# Patient Record
Sex: Male | Born: 1965 | Race: White | Hispanic: No | Marital: Married | Smoking: Current every day smoker
Health system: Southern US, Community
[De-identification: ages and names within clinical notes are randomized; demographics above are authoritative.]

---

## 2019-04-01 ENCOUNTER — Other Ambulatory Visit: Payer: Self-pay

## 2019-04-01 ENCOUNTER — Encounter: Payer: Self-pay | Admitting: Emergency Medicine

## 2019-04-01 ENCOUNTER — Emergency Department

## 2019-04-01 ENCOUNTER — Emergency Department
Admission: EM | Admit: 2019-04-01 | Discharge: 2019-04-01 | Disposition: A | Discharge status: Home / Self Care | Attending: Student | Admitting: Student

## 2019-04-01 DIAGNOSIS — R197 Diarrhea, unspecified: Secondary | ICD-10-CM | POA: Diagnosis not present

## 2019-04-01 DIAGNOSIS — R109 Unspecified abdominal pain: Secondary | ICD-10-CM | POA: Diagnosis present

## 2019-04-01 DIAGNOSIS — Z20828 Contact with and (suspected) exposure to other viral communicable diseases: Secondary | ICD-10-CM | POA: Diagnosis not present

## 2019-04-01 DIAGNOSIS — R1030 Lower abdominal pain, unspecified: Secondary | ICD-10-CM

## 2019-04-01 DIAGNOSIS — Z20822 Contact with and (suspected) exposure to covid-19: Secondary | ICD-10-CM

## 2019-04-01 LAB — COMPREHENSIVE METABOLIC PANEL
ALT: 69 U/L — ABNORMAL HIGH (ref 0–44)
AST: 55 U/L — ABNORMAL HIGH (ref 15–41)
Albumin: 4.2 g/dL (ref 3.5–5.0)
Alkaline Phosphatase: 80 U/L (ref 38–126)
Anion gap: 10 (ref 5–15)
BUN: 18 mg/dL (ref 6–20)
CO2: 21 mmol/L — ABNORMAL LOW (ref 22–32)
Calcium: 8.7 mg/dL — ABNORMAL LOW (ref 8.9–10.3)
Chloride: 105 mmol/L (ref 98–111)
Creatinine, Ser: 0.78 mg/dL (ref 0.61–1.24)
GFR calc Af Amer: >60 mL/min (ref >60)
GFR calc non Af Amer: >60 mL/min (ref >60)
Glucose, Bld: 213 mg/dL — ABNORMAL HIGH (ref 70–99)
Potassium: 3.6 mmol/L (ref 3.5–5.1)
Sodium: 136 mmol/L (ref 135–145)
Total Bilirubin: 0.8 mg/dL (ref 0.3–1.2)
Total Protein: 7.1 g/dL (ref 6.5–8.1)

## 2019-04-01 LAB — URINALYSIS, COMPLETE (UACMP) WITH MICROSCOPIC
Bacteria, UA: NONE SEEN
Bilirubin Urine: NEGATIVE
Glucose, UA: 150 mg/dL — AB
Hgb urine dipstick: NEGATIVE
Ketones, ur: 20 mg/dL — AB
Nitrite: NEGATIVE
Protein, ur: 30 mg/dL — AB
Specific Gravity, Urine: 1.034 — ABNORMAL HIGH (ref 1.005–1.030)
pH: 5 (ref 5.0–8.0)

## 2019-04-01 LAB — CBC WITH DIFFERENTIAL/PLATELET
Abs Immature Granulocytes: 0.04 10*3/uL (ref 0.00–0.07)
Basophils Absolute: 0 10*3/uL (ref 0.0–0.1)
Basophils Relative: 0 %
Eosinophils Absolute: 0 10*3/uL (ref 0.0–0.5)
Eosinophils Relative: 0 %
HCT: 40.5 % (ref 39.0–52.0)
Hemoglobin: 14 g/dL (ref 13.0–17.0)
Immature Granulocytes: 1 %
Lymphocytes Relative: 26 %
Lymphs Abs: 1.3 10*3/uL (ref 0.7–4.0)
MCH: 28.9 pg (ref 26.0–34.0)
MCHC: 34.6 g/dL (ref 30.0–36.0)
MCV: 83.5 fL (ref 80.0–100.0)
Monocytes Absolute: 0.6 10*3/uL (ref 0.1–1.0)
Monocytes Relative: 11 %
Neutro Abs: 3.1 10*3/uL (ref 1.7–7.7)
Neutrophils Relative %: 62 %
Platelets: 234 10*3/uL (ref 150–400)
RBC: 4.85 MIL/uL (ref 4.22–5.81)
RDW: 12.9 % (ref 11.5–15.5)
WBC: 5 10*3/uL (ref 4.0–10.5)
nRBC: 0 % (ref 0.0–0.2)

## 2019-04-01 LAB — TYPE AND SCREEN
ABO/RH(D): A POS
Antibody Screen: NEGATIVE

## 2019-04-01 LAB — LIPASE, BLOOD: Lipase: 32 U/L (ref 11–51)

## 2019-04-01 MED ORDER — MORPHINE SULFATE (PF) 4 MG/ML IV SOLN
4.0000 mg | Freq: Once | INTRAVENOUS | Status: AC
  Administered 2019-04-01: 4 mg via INTRAVENOUS
  Filled 2019-04-01: qty 1

## 2019-04-01 MED ORDER — SODIUM CHLORIDE 0.9 % IV BOLUS
1000.0000 mL | Freq: Once | INTRAVENOUS | Status: AC
  Administered 2019-04-01: 1000 mL via INTRAVENOUS

## 2019-04-01 MED ORDER — ONDANSETRON HCL 4 MG/2ML IJ SOLN
4.0000 mg | Freq: Once | INTRAMUSCULAR | Status: AC
  Administered 2019-04-01: 4 mg via INTRAVENOUS
  Filled 2019-04-01: qty 2

## 2019-04-01 MED ORDER — IOHEXOL 300 MG/ML  SOLN
100.0000 mL | Freq: Once | INTRAMUSCULAR | Status: AC | PRN
  Administered 2019-04-01: 100 mL via INTRAVENOUS

## 2019-04-01 NOTE — ED Triage Notes (Signed)
Pt reports had another flare up of diverticulitis that started last Saturday. Pt reports was given cipro and flagyl on 10/7 but is not getting any relief.

## 2019-04-01 NOTE — ED Provider Notes (Addendum)
[facility] Emergency Department Provider Note  ____________________________________________   First MD Initiated Contact with Patient 04/01/19 1435     (approximate)  I have reviewed the triage vital signs and the nursing notes.  History  Chief Complaint Abdominal Pain and Diarrhea    HPI CAMAR EVERINGHAM is a 53 y.o. male with a history of diverticulitis who presents for abdominal pain and diarrhea.  Patient states his symptoms first started on 10/3, and are consistent with his prior episode of diverticulitis.  He reports multiple episodes of loose stool daily.  Abdominal pain is sharp, left-sided, and moderate in intensity.  Associated with nausea, no vomiting.  He reports some dark appearing stools, as well as dark urine.  He is not on any blood thinning medications, he has not used any Imodium or Pepto-Bismol.  He has had decreased PO because of his pain and generalized malaise.  He denies any dysuria or hematuria.  No history of nephrolithiasis.  He was started on Cipro and Flagyl on 10/7 but states he has not had any improvement with this.  He reports fever at the onset of his symptoms, which have now improved.    Past Medical Hx History reviewed. No pertinent past medical history.  Problem List There are no active problems to display for this patient.   Past Surgical Hx History reviewed. No pertinent surgical history.  Medications Prior to Admission medications   Not on File    Allergies Patient has no allergy information on record.  Family Hx No family history on file.  Social Hx Social History   Tobacco Use  . Smoking status: Not on file  Substance Use Topics  . Alcohol use: Not on file  . Drug use: Not on file     Review of Systems  Constitutional: + fever Eyes: Negative for visual changes. ENT: Negative for sore throat. Cardiovascular: Negative for chest pain. Respiratory: Negative for shortness of breath.  Gastrointestinal: + abdominal pain, diarrhea Genitourinary: Negative for dysuria. Musculoskeletal: Negative for leg swelling. Skin: Negative for rash. Neurological: Negative for for headaches.   Physical Exam  Vital Signs: ED Triage Vitals  Enc Vitals Group     BP 04/01/19 1431 118/76     Pulse Rate 04/01/19 1431 (!) 104     Resp 04/01/19 1431 (!) 21     Temp 04/01/19 1431 98 F (36.7 C)     Temp Source 04/01/19 1431 Oral     SpO2 04/01/19 1431 97 %     Weight 04/01/19 1430 240 lb (108.9 kg)     Height 04/01/19 1430 6\' 2"  (1.88 m)     Head Circumference --      Peak Flow --      Pain Score 04/01/19 1430 6     Pain Loc --      Pain Edu? --      Excl. in [place]? --     Constitutional: Alert and oriented. Appears somewhat uncomfortable.  Head: Normocephalic. Atraumatic. Eyes: Conjunctivae clear. Sclera anicteric. Nose: No congestion. No rhinorrhea. Mouth/Throat: Mucous membranes are moist.  Neck: No stridor.   Cardiovascular: Normal rate, regular rhythm. Extremities well perfused. Respiratory: Normal respiratory effort.  Lungs CTAB. Gastrointestinal: Soft. Left sided abdominal tenderness, no rebound or guarding.  Remainder of abdomen is soft and nontender. Rectal: RN chaperone present. Brown stool, guaiac negative. Musculoskeletal: No lower extremity edema. No deformities. Neurologic:  Normal speech and language. No gross focal neurologic deficits are appreciated.  Skin: Skin  is warm, dry and intact. No rash noted. Psychiatric: Mood and affect are appropriate for situation.  EKG  Rate: 107 Rhythm: sinus Axis: borderline left Intervals: within normal limits No acute ischemic changes No STEMI   Radiology  CT: IMPRESSION:  1. No acute findings are noted in the abdomen or pelvis to account  for the patient's symptoms.  2. However, there is patchy ground-glass attenuation airspace  disease in the lung bases bilaterally, the appearance of which is  concerning for  atypical infection such as COVID-19 infection.  Further clinical evaluation is strongly recommended.  3. Mild colonic diverticulosis without evidence of acute  diverticulitis at this time.  4. Aortic atherosclerosis.   XR: IMPRESSION:  Patchy bilateral lower lung airspace opacities likely represent an  infectious process.    Procedures  Procedure(s) performed (including critical care):  Procedures   Initial Impression / Assessment and Plan / ED Course  53 y.o. male who presents to the ED for lower abdominal pain, diarrhea.  Ddx: diverticulitis, possible complicated diverticulitis given time course, colitis, other viral process  Plan: labs, imaging, symptom control, reassess  Labs without actionable derangements.  UA consistent with mild dehydration.  CT scan without acute intra-abdominal abnormalities, however the visualized lung bases do appear concerning for possible COVID.  This could be the etiology of his fever and diarrhea.  Updated patient on these results, he does state that he feels more fatigued and winded compared to normal, and that his taste and smell have been decreased over the same time period. He attributed this to assumed diverticulitis. He denies any known COVID exposures.   As such, will obtain a chest x-ray and COVID swab.  At this point he is hemodynamically stable, no evidence of respiratory distress, no hypoxia.  He is stable for discharge.  He understands that his COVID swab is pending, and understands the need for social distancing and quarantining until they results. Since he is already several days into his antibiotic regimen, advised completion of this. Discussed supportive care, return precautions.  Patient agreeable with plan and discharge.  Final Clinical Impression(s) / ED Diagnosis  Final diagnoses:  Lower abdominal pain  Diarrhea in adult patient  Suspected COVID-19 virus infection       Note:  This document was prepared using Dragon voice  recognition software and may include unintentional dictation errors.   Lilia Pro., MD 04/01/19 1744    Lilia Pro., MD 04/02/19 1002

## 2019-04-01 NOTE — Discharge Instructions (Signed)
Thank you for letting us take care of you in the emergency department.   At this time, your coronavirus swab results are pending. You should hear your results in 1-2 days if they are positive.   In the meantime, it is important to take precautions in case you are positive. This includes quarantining, wearing a mask, and social distancing.   Continue to take over the counter acetaminophen and ibuprofen as directed on the box to help with fevers as well as aches and pains.   Please return to the ER for any new or worsening symptoms, such as difficulty breathing, vomiting and diarrhea, or chest pain.

## 2019-04-02 LAB — SARS CORONAVIRUS 2 (TAT 6-24 HRS): SARS Coronavirus 2: NEGATIVE

## 2020-02-19 ENCOUNTER — Other Ambulatory Visit: Payer: Self-pay

## 2020-02-19 ENCOUNTER — Ambulatory Visit (INDEPENDENT_AMBULATORY_CARE_PROVIDER_SITE_OTHER): Admitting: Urology

## 2020-02-19 VITALS — BP 131/83 | HR 90 | Ht 74.0 in | Wt 236.7 lb

## 2020-02-19 DIAGNOSIS — N5082 Scrotal pain: Secondary | ICD-10-CM

## 2020-02-19 DIAGNOSIS — N4341 Spermatocele of epididymis, single: Secondary | ICD-10-CM

## 2020-02-19 LAB — URINALYSIS, COMPLETE
Bilirubin, UA: NEGATIVE
Leukocytes,UA: NEGATIVE
Nitrite, UA: NEGATIVE
Protein,UA: NEGATIVE
RBC, UA: NEGATIVE
Specific Gravity, UA: >1.03 — ABNORMAL HIGH (ref 1.005–1.030)
Urobilinogen, Ur: 0.2 mg/dL (ref 0.2–1.0)
pH, UA: 6 (ref 5.0–7.5)

## 2020-02-19 LAB — MICROSCOPIC EXAMINATION: Bacteria, UA: NONE SEEN

## 2020-02-19 NOTE — Progress Notes (Signed)
02/19/2020 9:51 AM   Alan Hamilton 1965/11/14 [id number]  Referring provider: Dionicia Abler, PA-C [address] [place],  [place] [address]  CC:   HPI:  [name] was referred for pelvic pain.  He had left groin discomfort for a few weeks. Nothing seems to make it better or worse. Resting makes it better. He sits a lot and makes it worse. Also he started working out again a few weeks ago. He has no voiding complaints. He has good stream. He needs to drink more water.   He had vasectomy about 10 years ago and bilateral hernia repairs in about 2003. 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 He has five kids still at home and three grown and gone.   His 09/21 PSA was 0.47.  He has a history of erectile dysfunction and takes Cialis 20 mg as needed.  PMH: No past medical history on file.  Surgical History: No past surgical history on file.  Home Medications:  Allergies as of 02/19/2020      Reactions   Statins Other (See Comments)   Muscle aches      Medication List       Accurate as of February 19, 2020  9:51 AM. If you have any questions, ask your nurse or doctor.        glipiZIDE 5 MG 24 hr tablet Commonly known as: GLUCOTROL XL Take 5 mg by mouth daily.   lisinopril 5 MG tablet Commonly known as: ZESTRIL Take 5 mg by mouth daily.   metFORMIN 500 MG 24 hr tablet Commonly known as: GLUCOPHAGE-XR Take 1,000 mg by mouth 2 (two) times daily.   Oxycodone HCl 10 MG Tabs Take 10 mg by mouth 4 (four) times daily as needed.   Precision QID Test test strip Generic drug: glucose blood Use 3 (three) times daily Use as instructed.   pregabalin 25 MG capsule Commonly known as: LYRICA Take by mouth.   Procysbi 300 MG Pack Generic drug: Cysteamine Bitartrate by XX route as directed   rosuvastatin 10 MG tablet Commonly known as: CRESTOR Take 10 mg by mouth at bedtime.   tadalafil 20 MG tablet Commonly known as: CIALIS   zolpidem  5 MG tablet Commonly known as: AMBIEN Take 5 mg by mouth at bedtime as needed.       Allergies:  Allergies  Allergen Reactions  . Statins Other (See Comments)    Muscle aches    Family History: No family history on file.  Social History:  has no history on file for tobacco use, alcohol use, and drug use.   Physical Exam: BP 131/83 (BP Location: Left Arm, Patient Position: Sitting, Cuff Size: Normal)   Pulse 90   Ht 6\' 2"  (1.88 m)   Wt 236 lb 11.2 oz (107.4 kg)   BMI 30.39 kg/m   Constitutional:  Alert and oriented, No acute distress. HEENT: [place] AT, moist mucus membranes.  Trachea midline, no masses. Cardiovascular: No clubbing, cyanosis, or edema. Respiratory: Normal respiratory effort, no increased work of breathing. GI: Abdomen is soft, nontender, nondistended, no abdominal masses GU: No CVA tenderness GU: Penis circumcised, normal foreskin, testicles descended bilaterally and palpably normal, left epididymis palpably normal and right 10 mm head spermatocele.  DRE: Prostate 30 g, smooth without hard area or nodule Lymph: No cervical or inguinal lymphadenopathy. Skin: No rashes, bruises or suspicious lesions. Neurologic: Grossly intact, no focal deficits, moving all 4 extremities. Psychiatric: Normal mood and affect.  Laboratory Data: Lab Results  Component Value Date   WBC 5.0 04/01/2019   HGB 14.0 04/01/2019   HCT 40.5 04/01/2019   MCV 83.5 04/01/2019   PLT 234 04/01/2019    Lab Results  Component Value Date   CREATININE 0.78 04/01/2019    No results found for: PSA  No results found for: TESTOSTERONE  No results found for: HGBA1C  Urinalysis    Component Value Date/Time   COLORURINE AMBER (A) 04/01/2019 1451   APPEARANCEUR HAZY (A) 04/01/2019 1451   LABSPEC 1.034 (H) 04/01/2019 1451   PHURINE 5.0 04/01/2019 1451   GLUCOSEU 150 (A) 04/01/2019 1451   HGBUR NEGATIVE 04/01/2019 1451   BILIRUBINUR NEGATIVE 04/01/2019 1451   KETONESUR 20 (A)  04/01/2019 1451   PROTEINUR 30 (A) 04/01/2019 1451   NITRITE NEGATIVE 04/01/2019 1451   LEUKOCYTESUR TRACE (A) 04/01/2019 1451    Lab Results  Component Value Date   BACTERIA NONE SEEN 04/01/2019    Pertinent Imaging: N/a  No results found for this or any previous visit.  No results found for this or any previous visit.  No results found for this or any previous visit.  No results found for this or any previous visit.  No results found for this or any previous visit.  No results found for this or any previous visit.  No results found for this or any previous visit.  No results found for this or any previous visit.   Assessment & Plan:    1. Scrotum pain He has a benign exam, normal PSA and UA.  I think a lot of this has to do with his activity changing recently.  He needs to continue to stay well-hydrated, do some stretching, take some NSAIDs.  We discussed referral to physical and occupational therapy of the pelvic floor and he will consider. - Urinalysis, Complete  2. Spermatocele  We discussed the benign nature of spermatoceles.  We discussed obtaining a scrotal ultrasound and he declined.  His testicles are easy to examine and were palpably normal.  He will continue to do self exams. No follow-ups on file.  He will call or return to clinic prn if these symptoms worsen or fail to improve as anticipated or if he notes any changes on self exam.   Alan Aloe, MD  [facility] [address], [place], [place] [address] [phone]

## 2020-03-18 IMAGING — CT CT ABD-PELV W/ CM
2 of 5 series · 15 of 46 positions shown, 17 images · IV contrast (APPLIED)
Comparison: No priors.

CLINICAL DATA: 53-year-old male with history of left lower quadrant
abdominal pain for the past 10 days.

EXAM:
CT ABDOMEN AND PELVIS WITH CONTRAST
TECHNIQUE: Multidetector CT imaging of the abdomen and pelvis was performed
using the standard protocol following bolus administration of
intravenous contrast.
CONTRAST:  100mL OMNIPAQUE IOHEXOL 300 MG/ML  SOLN

[Series 2: axial st · axial · 0.97mm/px · z∈[+270,+750]mm · 12 of 108 slices shown, 14 images]
[im 6/108  soft-tissue]
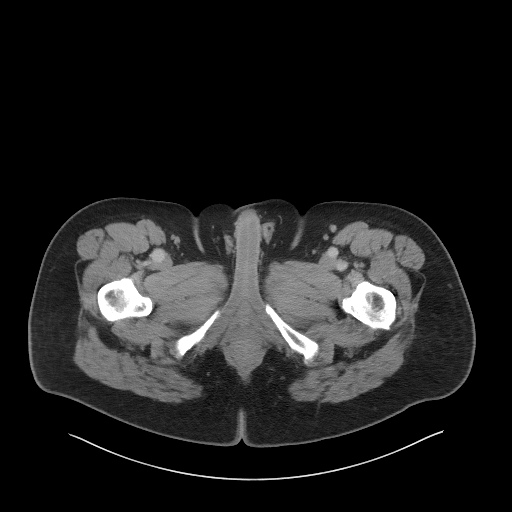
[im 6/108  bone]
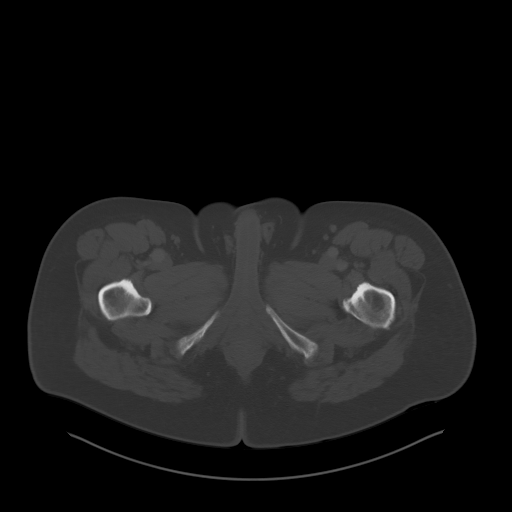
[im 17/108  soft-tissue]
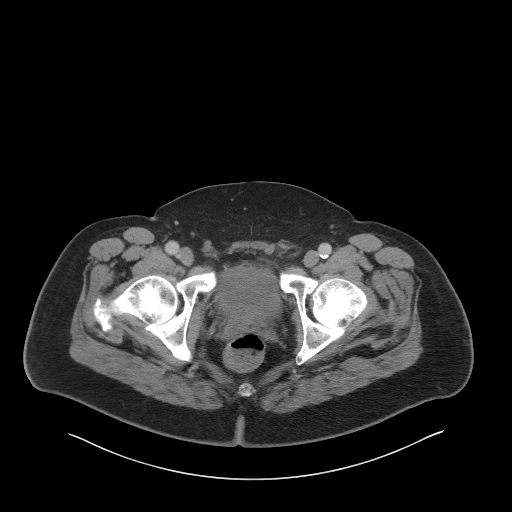
[im 22/108  soft-tissue]
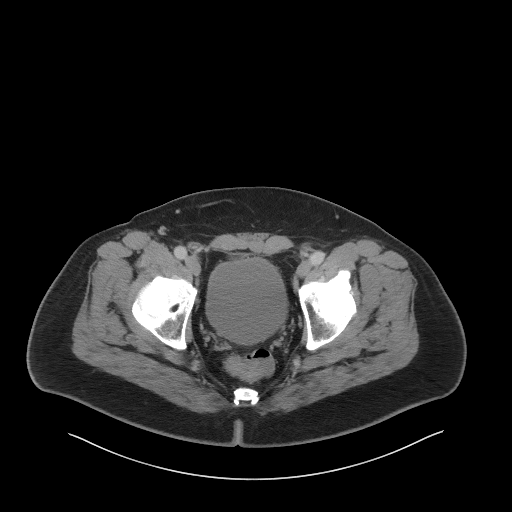
[im 33/108  soft-tissue]
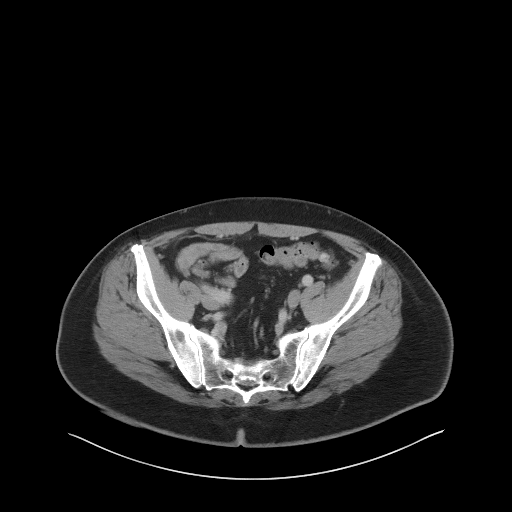
[im 43/108  soft-tissue]
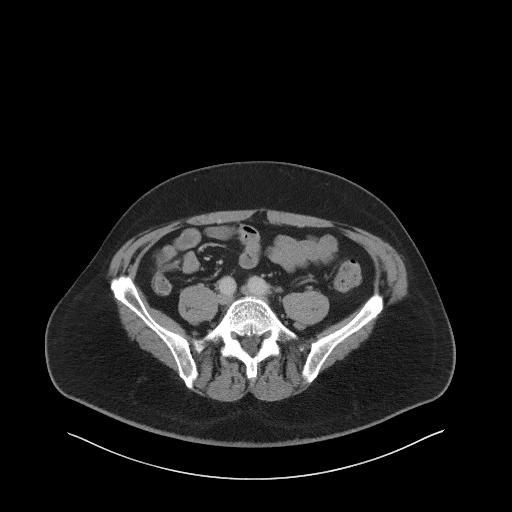
[im 49/108  soft-tissue]
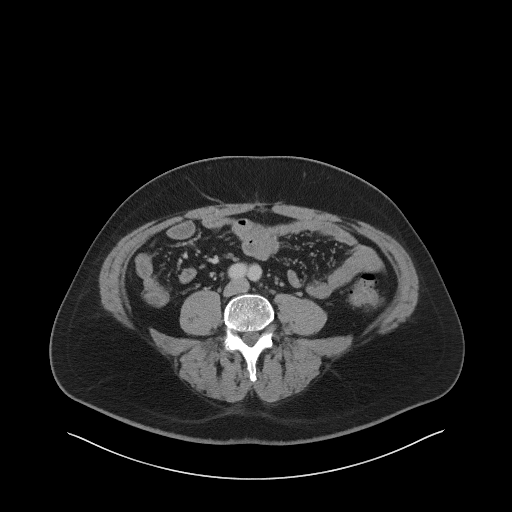
[im 59/108  soft-tissue]
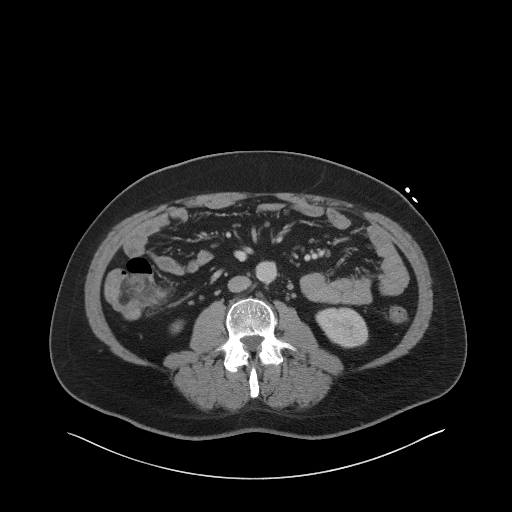
[im 65/108  soft-tissue]
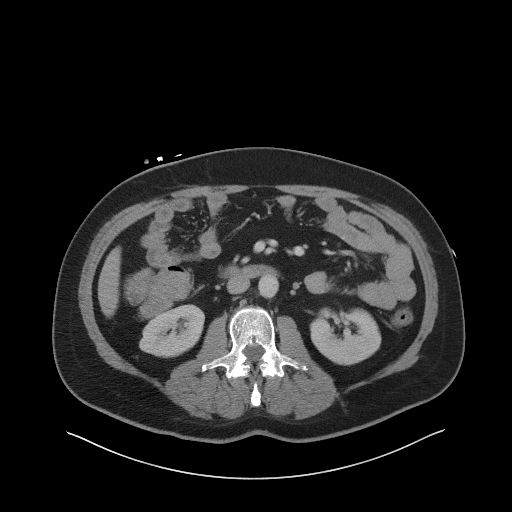
[im 75/108  soft-tissue]
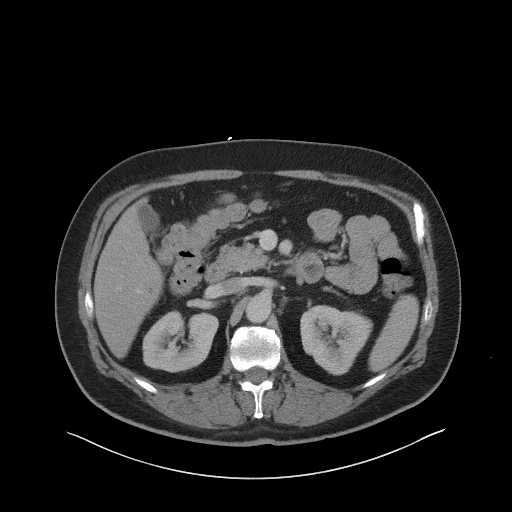
[im 75/108  bone]
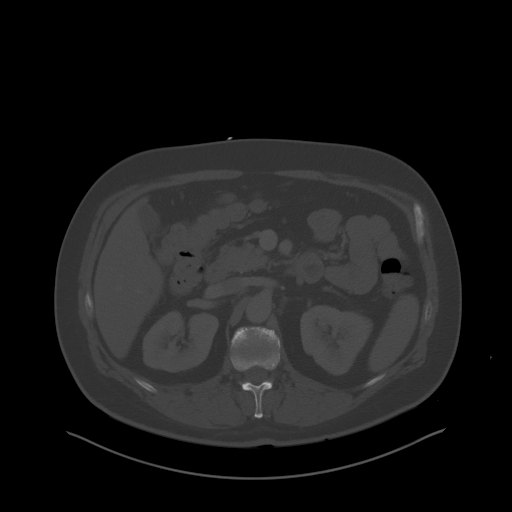
[im 86/108  soft-tissue]
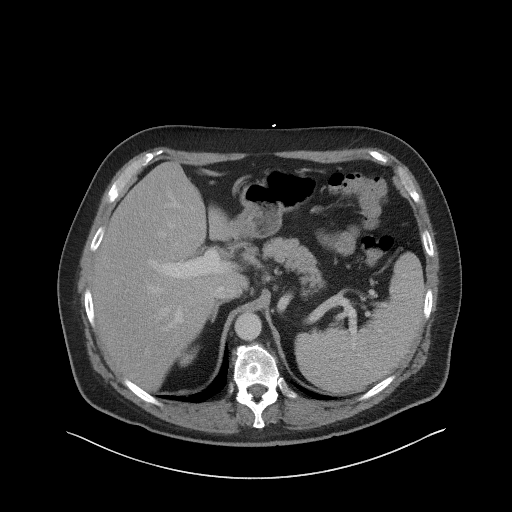
[im 91/108  soft-tissue]
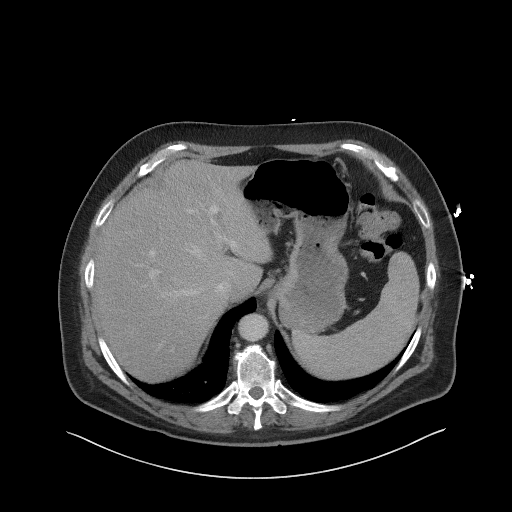
[im 102/108  soft-tissue]
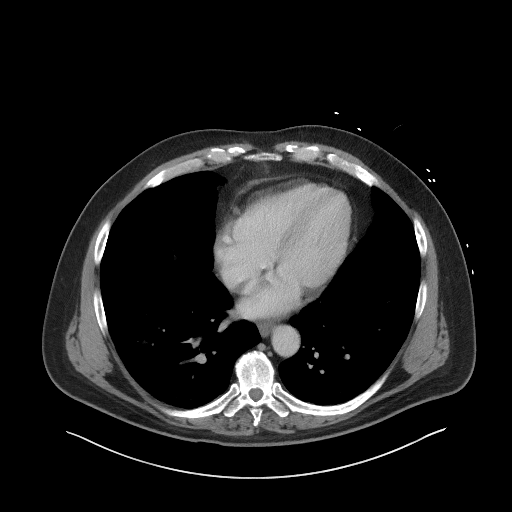

[Series 5: coronal st · coronal · 0.88mm/px · 3 of 99 slices shown]
[im 33/99  soft-tissue]
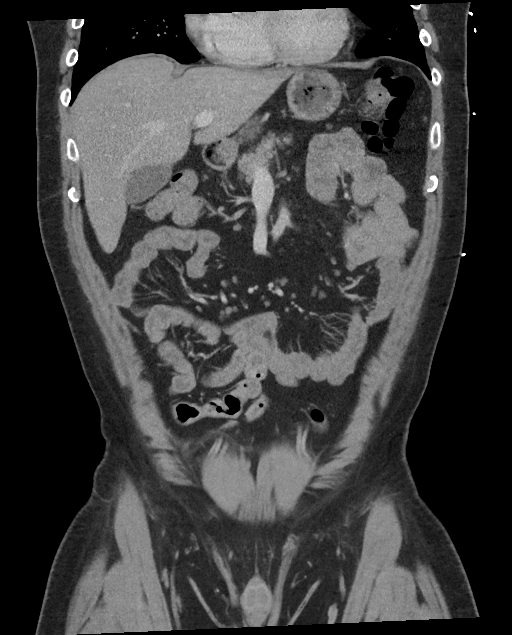
[im 44/99  soft-tissue]
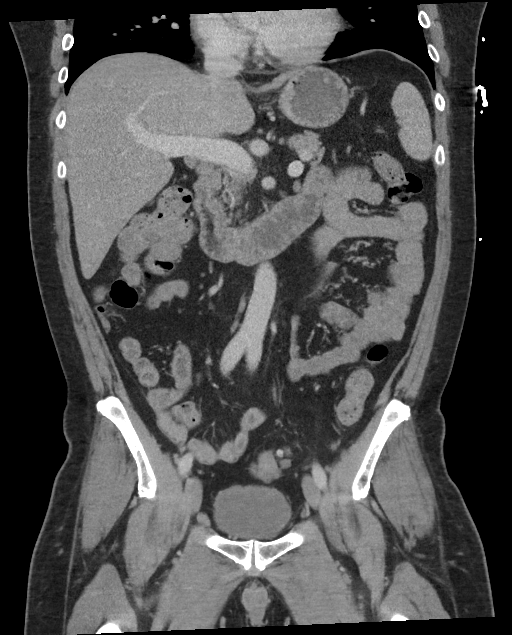
[im 55/99  soft-tissue]
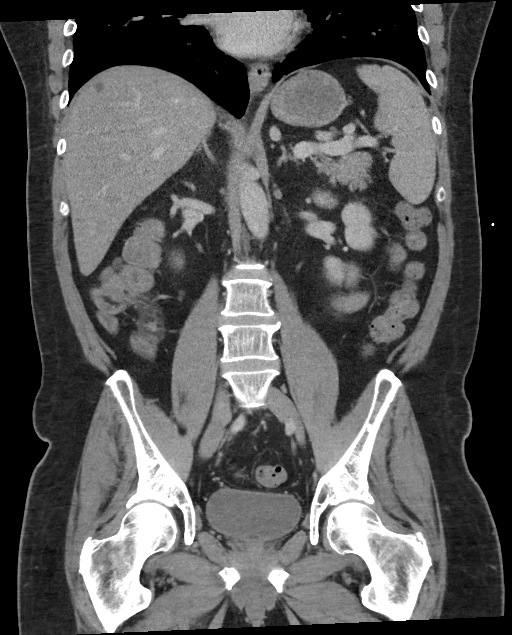

[15 of 46 positions shown; findings below may reference images not displayed]

FINDINGS: Lower chest: Patchy multifocal areas of ground-glass attenuation
noted in the visualized lung bases, concerning for multilobar
pneumonia.

Hepatobiliary: 1.2 cm low-attenuation lesion in segment 7 of the
liver, compatible with a simple cyst. Other subcentimeter
low-attenuation lesions scattered throughout the liver, incompletely
characterized on today's non-contrast CT examination, but
statistically likely to represent tiny cysts. No other larger more
suspicious appearing hepatic lesions. No intra or extrahepatic
biliary ductal dilatation. Gallbladder is unremarkable in
appearance.

Pancreas: No pancreatic mass. No pancreatic ductal dilatation. No
pancreatic or peripancreatic fluid collections or inflammatory
changes.

Spleen: Unremarkable.

Adrenals/Urinary Tract: Bilateral kidneys and bilateral adrenal
glands are normal in appearance. No hydroureteronephrosis. Urinary
bladder is normal in appearance.

Stomach/Bowel: Normal appearance of the stomach. No pathologic
dilatation of small bowel or colon. Numerous colonic diverticulae
are noted, without surrounding inflammatory changes to suggest an
acute diverticulitis at this time normal appendix.

Vascular/Lymphatic: Aortic atherosclerosis. No lymphadenopathy noted
in the abdomen or pelvis.

Reproductive: Prostate gland and seminal vesicles are unremarkable
in appearance.

Other: No significant volume of ascites.  No pneumoperitoneum.

Musculoskeletal: There are no aggressive appearing lytic or blastic
lesions noted in the visualized portions of the skeleton.
IMPRESSION: 1. No acute findings are noted in the abdomen or pelvis to account
for the patient's symptoms.
2. However, there is patchy ground-glass attenuation airspace
disease in the lung bases bilaterally, the appearance of which is
concerning for atypical infection such as [id number] infection.
Further clinical evaluation is strongly recommended.
3. Mild colonic diverticulosis without evidence of acute
diverticulitis at this time.
4. Aortic atherosclerosis.

These results were called by telephone at the time of interpretation
on 04/01/2019 at [DATE] to provider RTOYOTA JOSHJAX, who verbally
acknowledged these results.

## 2020-09-26 ENCOUNTER — Other Ambulatory Visit: Payer: Self-pay

## 2020-09-26 ENCOUNTER — Encounter: Payer: Self-pay | Admitting: Dermatology

## 2020-09-26 ENCOUNTER — Ambulatory Visit (INDEPENDENT_AMBULATORY_CARE_PROVIDER_SITE_OTHER): Admitting: Dermatology

## 2020-09-26 DIAGNOSIS — L820 Inflamed seborrheic keratosis: Secondary | ICD-10-CM

## 2020-09-26 DIAGNOSIS — D2361 Other benign neoplasm of skin of right upper limb, including shoulder: Secondary | ICD-10-CM

## 2020-09-26 DIAGNOSIS — L309 Dermatitis, unspecified: Secondary | ICD-10-CM

## 2020-09-26 DIAGNOSIS — D239 Other benign neoplasm of skin, unspecified: Secondary | ICD-10-CM

## 2020-09-26 DIAGNOSIS — D485 Neoplasm of uncertain behavior of skin: Secondary | ICD-10-CM

## 2020-09-26 DIAGNOSIS — L219 Seborrheic dermatitis, unspecified: Secondary | ICD-10-CM

## 2020-09-26 MED ORDER — HYDROCORTISONE 2.5 % EX CREA: TOPICAL_CREAM | CUTANEOUS | 1 refills | Status: DC

## 2020-09-26 MED ORDER — KETOCONAZOLE 2 % EX SHAM: MEDICATED_SHAMPOO | CUTANEOUS | 2 refills | Status: AC

## 2020-09-26 MED ORDER — KETOCONAZOLE 2 % EX CREA: TOPICAL_CREAM | CUTANEOUS | 2 refills | Status: DC

## 2020-09-26 MED ORDER — TRIAMCINOLONE ACETONIDE 0.1 % EX OINT: TOPICAL_OINTMENT | CUTANEOUS | 1 refills | Status: AC

## 2020-09-26 NOTE — Progress Notes (Signed)
New Patient Visit  Subjective  Alan Hamilton is a 55 y.o. male who presents for the following: Skin Problem (New patient here today for areas at hands that are dry and cracking. Present for years but has worsened this year with bleeding.  Also with redness and sensitivity at face, mostly around nose and beard area. Patient has a history of psoriasis but has been clear for many years. ).  Patient with hx of dysplastic nevus.   The following portions of the chart were reviewed this encounter and updated as appropriate:   Allergies  Meds  Problems  Med Hx  Surg Hx  Fam Hx      Review of Systems:  No other skin or systemic complaints except as noted in HPI or Assessment and Plan.  Objective  Well appearing patient in no apparent distress; mood and affect are within normal limits.  A focused examination was performed including face, neck, chest and back and arms, hands. Relevant physical exam findings are noted in the Assessment and Plan.  Objective  Left posterior shoulder: 0.6cm scaly pink papule    Objective  Right upper arm: Firm pink/brown papulenodule with dimple sign.   Objective  bilateral hands: Scaly pink plaques  Objective  Face: Pink patches with greasy scale.    Assessment & Plan  Neoplasm of uncertain behavior of skin Left posterior shoulder  Skin / nail biopsy Type of biopsy: tangential   Informed consent: discussed and consent obtained   Timeout: patient name, date of birth, surgical site, and procedure verified   Patient was prepped and draped in usual sterile fashion: Area prepped with isopropyl alcohol. Anesthesia: the lesion was anesthetized in a standard fashion   Local anesthetic: 0.5% bupivicaine buffered w/ 8.4% NaHCO3. Instrument used: flexible razor blade   Hemostasis achieved with: aluminum chloride   Outcome: patient tolerated procedure well   Post-procedure details: wound care instructions given   Additional details:  Mupirocin  and a bandage applied  Specimen 1 - Surgical pathology Differential Diagnosis: R/o Verruca vs SCC vs other  Check Margins: No 0.6cm scaly pink papule  Dermatofibroma Right upper arm  Benign-appearing.  Observation.  Call clinic for new or changing lesions.  Recommend daily use of broad spectrum spf 30+ sunscreen to sun-exposed areas.    Hand dermatitis bilateral hands  Recommend Gentle Skin Care. Handout attached.   Start TMC 0.1% ointment BID to hands for up to 2 weeks as needed for flares. Avoid applying to face, groin, and axilla. Use as directed. Risk of skin atrophy with long-term use reviewed.   Topical steroids (such as triamcinolone, fluocinolone, fluocinonide, mometasone, clobetasol, halobetasol, betamethasone, hydrocortisone) can cause thinning and lightening of the skin if they are used for too long in the same area. Your physician has selected the right strength medicine for your problem and area affected on the body. Please use your medication only as directed by your physician to prevent side effects.    Ordered Medications: triamcinolone ointment (KENALOG) 0.1 %  Seborrheic dermatitis Face  Chronic condition with duration over one year. Condition is bothersome to patient. Currently flared.  Start ketoconazole 2% shampoo 3x weekly leave on for 10 minutes before rinsing.  Start ketoconazole 2% cream BID as needed to face PRN rash.  Start HC 2.5% cream BID for up to 1 week as needed for flares. Risk of skin atrophy with long-term use reviewed.   Topical steroids (such as triamcinolone, fluocinolone, fluocinonide, mometasone, clobetasol, halobetasol, betamethasone, hydrocortisone) can cause  thinning and lightening of the skin if they are used for too long in the same area. Your physician has selected the right strength medicine for your problem and area affected on the body. Please use your medication only as directed by your physician to prevent side effects.     Ordered Medications: ketoconazole (NIZORAL) 2 % shampoo ketoconazole (NIZORAL) 2 % cream hydrocortisone 2.5 % cream  Return in about 2 months (around 11/26/2020) for rash follow up, cheek lower body, face, ears and scalp.  Graciella Belton, RMA, am acting as scribe for Forest Gleason, MD .   Documentation: I have reviewed the above documentation for accuracy and completeness, and I agree with the above.  Forest Gleason, MD

## 2020-09-26 NOTE — Patient Instructions (Addendum)
Wound Care Instructions  1. Cleanse wound gently with soap and water once a day then pat dry with clean gauze. Apply a thing coat of Petrolatum (petroleum jelly, "Vaseline") over the wound (unless you have an allergy to this). We recommend that you use a new, sterile tube of Vaseline. Do not pick or remove scabs. Do not remove the yellow or white "healing tissue" from the base of the wound.  2. Cover the wound with fresh, clean, nonstick gauze and secure with paper tape. You may use Band-Aids in place of gauze and tape if the would is small enough, but would recommend trimming much of the tape off as there is often too much. Sometimes Band-Aids can irritate the skin.  3. You should call the office for your biopsy report after 1 week if you have not already been contacted.  4. If you experience any problems, such as abnormal amounts of bleeding, swelling, significant bruising, significant pain, or evidence of infection, please call the office immediately.  5. FOR ADULT SURGERY PATIENTS: If you need something for pain relief you may take 1 extra strength Tylenol (acetaminophen) AND 2 Ibuprofen (200mg  each) together every 4 hours as needed for pain. (do not take these if you are allergic to them or if you have a reason you should not take them.) Typically, you may only need pain medication for 1 to 3 days.    Gentle Skin Care Guide  1. Bathe no more than once a day.  2. Avoid bathing in hot water  3. Use a mild soap like Dove, Vanicream, Cetaphil, CeraVe. Can use Lever 2000 or Cetaphil antibacterial soap  4. Use soap only where you need it. On most days, use it under your arms, between your legs, and on your feet. Let the water rinse other areas unless visibly dirty.  5. When you get out of the bath/shower, use a towel to gently blot your skin dry, don't rub it.  6. While your skin is still a little damp, apply a moisturizing cream such as Vanicream, CeraVe, Cetaphil, Eucerin, Sarna lotion or  plain Vaseline Jelly. For hands apply Neutrogena [place] Hand Cream or Excipial Hand Cream.  7. Reapply moisturizer any time you start to itch or feel dry.  8. Sometimes using free and clear laundry detergents can be helpful. Fabric softener sheets should be avoided. Downy Free & Gentle liquid, or any liquid fabric softener that is free of dyes and perfumes, it acceptable to use  9. If your doctor has given you prescription creams you may apply moisturizers over them    Topical steroids (such as triamcinolone, fluocinolone, fluocinonide, mometasone, clobetasol, halobetasol, betamethasone, hydrocortisone) can cause thinning and lightening of the skin if they are used for too long in the same area. Your physician has selected the right strength medicine for your problem and area affected on the body. Please use your medication only as directed by your physician to prevent side effects.    Recommend daily broad spectrum sunscreen SPF 30+ to sun-exposed areas, reapply every 2 hours as needed. Call for new or changing lesions.  Staying in the shade or wearing long sleeves, sun glasses (UVA+UVB protection) and wide brim hats (4-inch brim around the entire circumference of the hat) are also recommended for sun protection.    If you have any questions or concerns for your doctor, please call our main line at [phone] and press option 4 to reach your doctor's medical assistant. If no one answers, please leave a  voicemail as directed and we will return your call as soon as possible. Messages left after 4 pm will be answered the following business day.   You may also send [place] a message via [place]. We typically respond to MyChart messages within 1-2 business days.  For prescription refills, please ask your pharmacy to contact our office. Our fax number is [phone].  If you have an urgent issue when the clinic is closed that cannot wait until the next business day, you can page your doctor at the  number below.    Please note that while we do our best to be available for urgent issues outside of office hours, we are not available 24/7.   If you have an urgent issue and are unable to reach [place], you may choose to seek medical care at your doctor's office, retail clinic, urgent care center, or emergency room.  If you have a medical emergency, please immediately call 911 or go to the emergency department.  Pager Numbers  - Dr. Nehemiah Massed: [phone]  - Dr. Laurence Ferrari: [phone]  - Dr. Nicole Kindred: [phone]  In the event of inclement weather, please call our main line at [phone] for an update on the status of any delays or closures.  Dermatology Medication Tips: Please keep the boxes that topical medications come in in order to help keep track of the instructions about where and how to use these. Pharmacies typically print the medication instructions only on the boxes and not directly on the medication tubes.   If your medication is too expensive, please contact our office at [phone] option 4 or send [place] a message through [place].   We are unable to tell what your co-pay for medications will be in advance as this is different depending on your insurance coverage. However, we may be able to find a substitute medication at lower cost or fill out paperwork to get insurance to cover a needed medication.   If a prior authorization is required to get your medication covered by your insurance company, please allow [place] 1-2 business days to complete this process.  Drug prices often vary depending on where the prescription is filled and some pharmacies may offer cheaper prices.  The website www.goodrx.com contains coupons for medications through different pharmacies. The prices here do not account for what the cost may be with help from insurance (it may be cheaper with your insurance), but the website can give you the price if you did not use any insurance.  - You can print the associated  coupon and take it with your prescription to the pharmacy.  - You may also stop by our office during regular business hours and pick up a GoodRx coupon card.  - If you need your prescription sent electronically to a different pharmacy, notify our office through [facility] or by phone at [phone] option 4.

## 2020-09-28 ENCOUNTER — Telehealth: Payer: Self-pay

## 2020-09-28 NOTE — Telephone Encounter (Signed)
-----   Message from Alfonso Patten, MD sent at 09/28/2020 10:35 AM EDT ----- Skin , left posterior shoulder, shave SEBORRHEIC KERATOSIS, IRRITATED  This is a benign growth or "wisdom spot". No additional treatment is needed.    MAs please call. Thank you!

## 2020-09-28 NOTE — Telephone Encounter (Signed)
Patient advised bx benign, no further treatment needed, JS

## 2020-12-07 ENCOUNTER — Other Ambulatory Visit: Payer: Self-pay

## 2020-12-07 ENCOUNTER — Ambulatory Visit (INDEPENDENT_AMBULATORY_CARE_PROVIDER_SITE_OTHER): Admitting: Dermatology

## 2020-12-07 ENCOUNTER — Encounter: Payer: Self-pay | Admitting: Dermatology

## 2020-12-07 DIAGNOSIS — Z1283 Encounter for screening for malignant neoplasm of skin: Secondary | ICD-10-CM

## 2020-12-07 DIAGNOSIS — D1800 Hemangioma unspecified site: Secondary | ICD-10-CM

## 2020-12-07 DIAGNOSIS — D2371 Other benign neoplasm of skin of right lower limb, including hip: Secondary | ICD-10-CM | POA: Diagnosis not present

## 2020-12-07 DIAGNOSIS — L578 Other skin changes due to chronic exposure to nonionizing radiation: Secondary | ICD-10-CM

## 2020-12-07 DIAGNOSIS — L309 Dermatitis, unspecified: Secondary | ICD-10-CM | POA: Diagnosis not present

## 2020-12-07 DIAGNOSIS — D229 Melanocytic nevi, unspecified: Secondary | ICD-10-CM

## 2020-12-07 DIAGNOSIS — L821 Other seborrheic keratosis: Secondary | ICD-10-CM

## 2020-12-07 DIAGNOSIS — L219 Seborrheic dermatitis, unspecified: Secondary | ICD-10-CM

## 2020-12-07 DIAGNOSIS — D485 Neoplasm of uncertain behavior of skin: Secondary | ICD-10-CM

## 2020-12-07 DIAGNOSIS — L814 Other melanin hyperpigmentation: Secondary | ICD-10-CM

## 2020-12-07 DIAGNOSIS — L918 Other hypertrophic disorders of the skin: Secondary | ICD-10-CM

## 2020-12-07 DIAGNOSIS — D239 Other benign neoplasm of skin, unspecified: Secondary | ICD-10-CM

## 2020-12-07 NOTE — Patient Instructions (Addendum)
Pre-Operative Instructions  You are scheduled for a surgical procedure at [facility]. We recommend you read the following instructions. If you have any questions or concerns, please call the office at [phone].  Shower and wash the entire body with soap and water the day of your surgery paying special attention to cleansing at and around the planned surgery site.  Avoid aspirin or aspirin containing products at least fourteen (14) days prior to your surgical procedure and for at least one week (7 Days) after your surgical procedure. If you take aspirin on a regular basis for heart disease or history of stroke or for any other reason, we may recommend you continue taking aspirin but please notify us if you take this on a regular basis. Aspirin can cause more bleeding to occur during surgery as well as prolonged bleeding and bruising after surgery.   Avoid other nonsteroidal pain medications at least one week prior to surgery and at least one week prior to your surgery. These include medications such as Ibuprofen (Motrin, Advil and Nuprin), Naprosyn, Voltaren, Relafen, etc. If medications are used for therapeutic reasons, please inform us as they can cause increased bleeding or prolonged bleeding during and bruising after surgical procedures.   Please advise [place] if you are taking any "blood thinner" medications such as Coumadin or Dipyridamole or Plavix or similar medications. These cause increased bleeding and prolonged bleeding during procedures and bruising after surgical procedures. We may have to consider discontinuing these medications briefly prior to and shortly after your surgery if safe to do so.   Please inform us of all medications you are currently taking. All medications that are taken regularly should be taken the day of surgery as you always do. Nevertheless, we need to be informed of what medications you are taking prior to surgery to know whether they will affect the  procedure or cause any complications.   Please inform us of any medication allergies. Also inform us of whether you have allergies to Latex or rubber products or whether you have had any adverse reaction to Lidocaine or Epinephrine.  Please inform us of any prosthetic or artificial body parts such as artificial heart valve, joint replacements, etc., or similar condition that might require preoperative antibiotics.   We recommend avoidance of alcohol at least two weeks prior to surgery and continued avoidance for at least two weeks after surgery.   We recommend discontinuation of tobacco smoking at least two weeks prior to surgery and continued abstinence for at least two weeks after surgery.  Do not plan strenuous exercise, strenuous work or strenuous lifting for approximately four weeks after your surgery.   We request if you are unable to make your scheduled surgical appointment, please call us at least a week in advance or as soon as you are aware of a problem so that we can cancel or reschedule the appointment.   You MAY TAKE TYLENOL (acetaminophen) for pain as it is not a blood thinner.   PLEASE PLAN TO BE IN TOWN FOR TWO WEEKS FOLLOWING SURGERY, THIS IS IMPORTANT SO YOU CAN BE CHECKED FOR DRESSING CHANGES, SUTURE REMOVAL AND TO MONITOR FOR POSSIBLE COMPLICATIONS.  Melanoma ABCDEs  Melanoma is the most dangerous type of skin cancer, and is the leading cause of death from skin disease.  You are more likely to develop melanoma if you: Have light-colored skin, light-colored eyes, or red or blond hair Spend a lot of time in the sun Tan regularly, either outdoors or  in a tanning bed Have had blistering sunburns, especially during childhood Have a close family member who has had a melanoma Have atypical moles or large birthmarks  Early detection of melanoma is key since treatment is typically straightforward and cure rates are extremely high if we catch it early.   The first sign of  melanoma is often a change in a mole or a new dark spot.  The ABCDE system is a way of remembering the signs of melanoma.  A for asymmetry:  The two halves do not match. B for border:  The edges of the growth are irregular. C for color:  A mixture of colors are present instead of an even brown color. D for diameter:  Melanomas are usually (but not always) greater than 66mm - the size of a pencil eraser. E for evolution:  The spot keeps changing in size, shape, and color.  Please check your skin once per month between visits. You can use a small mirror in front and a large mirror behind you to keep an eye on the back side or your body.   If you see any new or changing lesions before your next follow-up, please call to schedule a visit.  Please continue daily skin protection including broad spectrum sunscreen SPF 30+ to sun-exposed areas, reapplying every 2 hours as needed when you're outdoors.    Recommend taking Heliocare sun protection supplement daily in sunny weather for additional sun protection. For maximum protection on the sunniest days, you can take up to 2 capsules of regular Heliocare OR take 1 capsule of Heliocare Ultra. For prolonged exposure (such as a full day in the sun), you can repeat your dose of the supplement 4 hours after your first dose. Heliocare can be purchased at [facility] or at [email or web address].    If you have any questions or concerns for your doctor, please call our main line at [phone] and press option 4 to reach your doctor's medical assistant. If no one answers, please leave a voicemail as directed and we will return your call as soon as possible. Messages left after 4 pm will be answered the following business day.   You may also send [place] a message via [place]. We typically respond to MyChart messages within 1-2 business days.  For prescription refills, please ask your pharmacy to contact our office. Our fax number is [phone].  If you have an  urgent issue when the clinic is closed that cannot wait until the next business day, you can page your doctor at the number below.    Please note that while we do our best to be available for urgent issues outside of office hours, we are not available 24/7.   If you have an urgent issue and are unable to reach [place], you may choose to seek medical care at your doctor's office, retail clinic, urgent care center, or emergency room.  If you have a medical emergency, please immediately call 911 or go to the emergency department.  Pager Numbers  - Dr. Nehemiah Massed: [phone]  - Dr. Laurence Ferrari: [phone]  - Dr. Nicole Kindred: [phone]  In the event of inclement weather, please call our main line at [phone] for an update on the status of any delays or closures.  Dermatology Medication Tips: Please keep the boxes that topical medications come in in order to help keep track of the instructions about where and how to use these. Pharmacies typically print the medication instructions only on the boxes and not  directly on the medication tubes.   If your medication is too expensive, please contact our office at [phone] option 4 or send [place] a message through [place].   We are unable to tell what your co-pay for medications will be in advance as this is different depending on your insurance coverage. However, we may be able to find a substitute medication at lower cost or fill out paperwork to get insurance to cover a needed medication.   If a prior authorization is required to get your medication covered by your insurance company, please allow [place] 1-2 business days to complete this process.  Drug prices often vary depending on where the prescription is filled and some pharmacies may offer cheaper prices.  The website www.goodrx.com contains coupons for medications through different pharmacies. The prices here do not account for what the cost may be with help from insurance (it may be cheaper with your  insurance), but the website can give you the price if you did not use any insurance.  - You can print the associated coupon and take it with your prescription to the pharmacy.  - You may also stop by our office during regular business hours and pick up a GoodRx coupon card.  - If you need your prescription sent electronically to a different pharmacy, notify our office through [facility] or by phone at [phone] option 4.

## 2020-12-07 NOTE — Progress Notes (Signed)
Follow-Up Visit   Subjective  Alan Hamilton is a 55 y.o. male who presents for the following: FBSE (Patient here for full body skin exam and skin cancer screening. Patient with no hx of skin cancer. There is a spot at groin that he would like checked, present for about 1 year.) and Dermatitis (Patient using TMC 0.1% ointment for hand dermatitis. Patient advises it does help when he uses it but is not consistent. Patient also using ketoconazole shampoo and cream, as well as HC cream as needed for seb derm. He feels that has improved. ).   The following portions of the chart were reviewed this encounter and updated as appropriate:   Allergies  Meds  Problems  Med Hx  Surg Hx  Fam Hx       Review of Systems:  No other skin or systemic complaints except as noted in HPI or Assessment and Plan.  Objective  Well appearing patient in no apparent distress; mood and affect are within normal limits.  A full examination was performed including scalp, head, eyes, ears, nose, lips, neck, chest, axillae, abdomen, back, buttocks, bilateral upper extremities, bilateral lower extremities, hands, feet, fingers, toes, fingernails, and toenails. All findings within normal limits unless otherwise noted below.  Head - Anterior (Face) clear  Right Hand - Anterior Xerosis and few scaly pink plaques  Right Thigh, right upper arm, right posterior calf Firm pink/brown papulenodule with dimple sign.   Right Medial Thigh 1.1cm subcutaneous nodule   Assessment & Plan  Seborrheic dermatitis Head - Anterior (Face)  Continue ketoconazole 2% shampoo apply three times per week, massage into scalp and leave in for 10 minutes before rinsing out as needed  Continue ketoconazole 2% cream twice daily as needed Continue HC 2.5% cream twice daily for up to 1 week to face as needed for flares  Chronic condition with duration or expected duration over one year. Currently well-controlled.   Related  Medications ketoconazole (NIZORAL) 2 % shampoo Wash AA 3 times weekly leaving on for 10 minutes before rinsing.  ketoconazole (NIZORAL) 2 % cream Apply to AA face BID PRN rash  hydrocortisone 2.5 % cream Apply to AA face BID for up to 1 week PRN flares. Avoid applying to face, groin, and axilla. Use as directed. Risk of skin atrophy with long-term use reviewed.  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 Avoid applying to face, groin, and axilla. Use as directed. Risk of skin atrophy with long-term use reviewed.   Topical steroids (such as triamcinolone, fluocinolone, fluocinonide, mometasone, clobetasol, halobetasol, betamethasone, hydrocortisone) can cause thinning and lightening of the skin if they are used for too long in the same area. Your physician has selected the right strength medicine for your problem and area affected on the body. Please use your medication only as directed by your physician to prevent side effects.   Recommend gentle skin care, gloves for wet work, emollient to damp hands.   Dermatofibroma Right Thigh, right upper arm, right posterior calf  Benign-appearing.  Observation.  Call clinic for new or changing lesions.  Recommend daily use of broad spectrum spf 30+ sunscreen to sun-exposed areas.    Neoplasm of uncertain behavior of skin Right Medial Thigh  Present about 1 year, getting larger  R/o Lipoma vs Cyst vs Nevus Superficialis Lipomatosus vs Other  Plan excision    Lentigines - Scattered tan macules - Due to sun exposure -  Benign-appering, observe - Recommend daily broad spectrum sunscreen SPF 30+ to sun-exposed areas, reapply every 2 hours as needed. - Call for any changes  Seborrheic Keratoses - Stuck-on, waxy, tan-brown papules and/or plaques  - Benign-appearing - Discussed benign etiology and prognosis. - Observe - Call for any changes  Melanocytic  Nevi - Tan-brown and/or pink-flesh-colored symmetric macules and papules - Benign appearing on exam today - Observation - Call clinic for new or changing moles - Recommend daily use of broad spectrum spf 30+ sunscreen to sun-exposed areas.   Hemangiomas - Red papules - Discussed benign nature - Observe - Call for any changes  Actinic Damage - Chronic condition, secondary to cumulative UV/sun exposure - diffuse scaly erythematous macules with underlying dyspigmentation - Recommend daily broad spectrum sunscreen SPF 30+ to sun-exposed areas, reapply every 2 hours as needed.  - Staying in the shade or wearing long sleeves, sun glasses (UVA+UVB protection) and wide brim hats (4-inch brim around the entire circumference of the hat) are also recommended for sun protection.  - Call for new or changing lesions.  Skin cancer screening performed today.  Acrochordons (Skin Tags) - Fleshy, skin-colored pedunculated papules - Benign appearing.  - Observe. - If desired, they can be removed with an in office procedure that is not covered by insurance. - Please call the clinic if you notice any new or changing lesions.  Return for Surgery, 1 year FBSE.  Graciella Belton, RMA, am acting as scribe for Forest Gleason, MD .  Documentation: I have reviewed the above documentation for accuracy and completeness, and I agree with the above.  Forest Gleason, MD

## 2021-01-24 ENCOUNTER — Encounter: Payer: Self-pay | Admitting: Dermatology

## 2021-01-24 ENCOUNTER — Other Ambulatory Visit: Payer: Self-pay

## 2021-01-24 ENCOUNTER — Ambulatory Visit (INDEPENDENT_AMBULATORY_CARE_PROVIDER_SITE_OTHER): Admitting: Dermatology

## 2021-01-24 DIAGNOSIS — D485 Neoplasm of uncertain behavior of skin: Secondary | ICD-10-CM | POA: Diagnosis not present

## 2021-01-24 MED ORDER — CEPHALEXIN 500 MG PO CAPS: 500.0000 mg | ORAL_CAPSULE | Freq: Two times a day (BID) | ORAL | 0 refills | Status: AC

## 2021-01-24 MED ORDER — MUPIROCIN 2 % EX OINT: 1.0000 "application " | TOPICAL_OINTMENT | Freq: Every day | CUTANEOUS | 0 refills | Status: AC

## 2021-01-24 NOTE — Patient Instructions (Addendum)
Wound Care Instructions  Cleanse wound gently with soap and water once a day then pat dry with clean gauze. Apply a thing coat of Petrolatum (petroleum jelly, "Vaseline") over the wound (unless you have an allergy to this). We recommend that you use a new, sterile tube of Vaseline. Do not pick or remove scabs. Do not remove the yellow or white "healing tissue" from the base of the wound.  Cover the wound with fresh, clean, nonstick gauze and secure with paper tape. You may use Band-Aids in place of gauze and tape if the would is small enough, but would recommend trimming much of the tape off as there is often too much. Sometimes Band-Aids can irritate the skin.  You should call the office for your biopsy report after 1 week if you have not already been contacted.  If you experience any problems, such as abnormal amounts of bleeding, swelling, significant bruising, significant pain, or evidence of infection, please call the office immediately.  FOR ADULT SURGERY PATIENTS: If you need something for pain relief you may take 1 extra strength Tylenol (acetaminophen) AND 2 Ibuprofen (200mg each) together every 4 hours as needed for pain. (do not take these if you are allergic to them or if you have a reason you should not take them.) Typically, you may only need pain medication for 1 to 3 days.   If you have any questions or concerns for your doctor, please call our main line at [phone] and press option 4 to reach your doctor's medical assistant. If no one answers, please leave a voicemail as directed and we will return your call as soon as possible. Messages left after 4 pm will be answered the following business day.   You may also send us a message via MyChart. We typically respond to MyChart messages within 1-2 business days.  For prescription refills, please ask your pharmacy to contact our office. Our fax number is [phone].  If you have an urgent issue when the clinic is closed that  cannot wait until the next business day, you can page your doctor at the number below.    Please note that while we do our best to be available for urgent issues outside of office hours, we are not available 24/7.   If you have an urgent issue and are unable to reach us, you may choose to seek medical care at your doctor's office, retail clinic, urgent care center, or emergency room.  If you have a medical emergency, please immediately call 911 or go to the emergency department.  Pager Numbers  - Dr. Kowalski: [phone]  - Dr. Moye: [phone]  - Dr. Stewart: [phone]  In the event of inclement weather, please call our main line at [phone] for an update on the status of any delays or closures.  Dermatology Medication Tips: Please keep the boxes that topical medications come in in order to help keep track of the instructions about where and how to use these. Pharmacies typically print the medication instructions only on the boxes and not directly on the medication tubes.   If your medication is too expensive, please contact our office at [phone] option 4 or send us a message through MyChart.   We are unable to tell what your co-pay for medications will be in advance as this is different depending on your insurance coverage. However, we may be able to find a substitute medication at lower cost or fill out paperwork to get insurance to cover a needed   medication.   If a prior authorization is required to get your medication covered by your insurance company, please allow us 1-2 business days to complete this process.  Drug prices often vary depending on where the prescription is filled and some pharmacies may offer cheaper prices.  The website www.goodrx.com contains coupons for medications through different pharmacies. The prices here do not account for what the cost may be with help from insurance (it may be cheaper with your insurance), but the website can give you the  price if you did not use any insurance.  - You can print the associated coupon and take it with your prescription to the pharmacy.  - You may also stop by our office during regular business hours and pick up a GoodRx coupon card.  - If you need your prescription sent electronically to a different pharmacy, notify our office through [facility] MyChart or by phone at [phone] option 4.   

## 2021-01-24 NOTE — Progress Notes (Signed)
   Follow-Up Visit   Subjective  Alan Hamilton is a 55 y.o. male who presents for the following: Procedure (Patient here for excision at Right Medial Thigh for Lipoma vs Cyst vs Nevus Superficialis Lipomatosus vs Other).  The following portions of the chart were reviewed this encounter and updated as appropriate:   Allergies  Meds  Problems  Med Hx  Surg Hx  Fam Hx      Review of Systems:  No other skin or systemic complaints except as noted in HPI or Assessment and Plan.  Objective  Well appearing patient in no apparent distress; mood and affect are within normal limits.  A focused examination was performed including right thigh. Relevant physical exam findings are noted in the Assessment and Plan.  Right Medial Thigh 2.6cm soft slightly erythematous subcutaneous nodule   Assessment & Plan  Neoplasm of uncertain behavior of skin Right Medial Thigh  Skin excision  Lesion length (cm):  2.6 Total excision diameter (cm):  2.6 Informed consent: discussed and consent obtained   Timeout: patient name, date of birth, surgical site, and procedure verified   Procedure prep:  Patient was prepped and draped in usual sterile fashion Prep type:  Chlorhexidine Anesthesia: the lesion was anesthetized in a standard fashion   Anesthetic:  1% lidocaine w/ epinephrine 1-100,000 buffered w/ 8.4% NaHCO3 (3cc lido w/epi, 7.5cc 25% bupivicaine) Instrument used comment:  15c Hemostasis achieved with: pressure and electrodesiccation    Skin repair Complexity:  Intermediate Final length (cm):  4.2 Informed consent: discussed and consent obtained   Timeout: patient name, date of birth, surgical site, and procedure verified   Procedure prep:  Patient was prepped and draped in usual sterile fashion Prep type:  Chlorhexidine Anesthesia: the lesion was anesthetized in a standard fashion   Anesthetic:  1% lidocaine w/ epinephrine 1-100,000 local infiltration Reason for type of repair: reduce  tension to allow closure, reduce the risk of dehiscence, infection, and necrosis, reduce subcutaneous dead space and avoid a hematoma, allow closure of the large defect, allow side-to-side closure without requiring a flap or graft and enhance both functionality and cosmetic results   Undermining: area extensively undermined   Subcutaneous layers (deep stitches):  Suture size:  3-0 Suture type: Vicryl (polyglactin 910)   Stitches:  Buried vertical mattress Fine/surface layer approximation (top stitches):  Suture size:  4-0 Suture type: Prolene (polypropylene)   Stitches comment:  Running horizontal mattress Suture removal (days):  9 Hemostasis achieved with: pressure and electrodesiccation Outcome: patient tolerated procedure well with no complications   Post-procedure details: wound care instructions given   Additional details:  Mupirocin and a pressure bandage applied   mupirocin ointment (BACTROBAN) 2 % Apply 1 application topically daily. With dressing changes  cephALEXin (KEFLEX) 500 MG capsule Take 1 capsule (500 mg total) by mouth 2 (two) times daily for 5 days.  Specimen 1 - Surgical pathology Differential Diagnosis: R/o Lipoma vs Cyst vs Nevus Superficialis Lipomatosus vs Other  Check Margins: No 2.6cm soft slightly erythematous subcutaneous nodule  Start cephalexin '500mg'$  twice daily x 5 days  Return in about 9 days (around 02/02/2021) for Suture Removal.  Graciella Belton, RMA, am acting as scribe for Forest Gleason, MD .  Documentation: I have reviewed the above documentation for accuracy and completeness, and I agree with the above.  Forest Gleason, MD

## 2021-01-25 ENCOUNTER — Telehealth: Payer: Self-pay

## 2021-01-25 NOTE — Telephone Encounter (Signed)
Patient doing fine following yesterday's surgery.Alan Hamilton

## 2021-01-29 ENCOUNTER — Encounter: Payer: Self-pay | Admitting: Dermatology

## 2021-01-31 ENCOUNTER — Telehealth: Payer: Self-pay

## 2021-01-31 NOTE — Telephone Encounter (Signed)
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 No additional treatment needed.  MAs please call. Thank you!

## 2021-02-02 ENCOUNTER — Ambulatory Visit (INDEPENDENT_AMBULATORY_CARE_PROVIDER_SITE_OTHER): Admitting: Dermatology

## 2021-02-02 ENCOUNTER — Other Ambulatory Visit: Payer: Self-pay

## 2021-02-02 DIAGNOSIS — Z4802 Encounter for removal of sutures: Secondary | ICD-10-CM

## 2021-02-02 NOTE — Progress Notes (Signed)
   Follow-Up Visit   Subjective  Alan Hamilton is a 55 y.o. male who presents for the following: Follow-up (Patient here today for suture removal. ).   The following portions of the chart were reviewed this encounter and updated as appropriate:   Allergies  Meds  Problems  Med Hx  Surg Hx  Fam Hx      Review of Systems:  No other skin or systemic complaints except as noted in HPI or Assessment and Plan.  Objective  Well appearing patient in no apparent distress; mood and affect are within normal limits.  A focused examination was performed including right thigh. Relevant physical exam findings are noted in the Assessment and Plan.    Assessment & Plan   Encounter for Removal of Sutures - Incision site at the right medial thigh is clean, dry and intact - Wound cleansed, sutures removed, wound cleansed and steri strips applied.  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 - Scars remodel for a full year. - Once steri-strips fall off, patient can apply over-the-counter silicone scar cream each night to help with scar remodeling if desired. - Patient advised to call with any concerns or if they notice any new or changing lesions.  Return for TBSE, as scheduled.  Graciella Belton, RMA, am acting as scribe for Forest Gleason, MD .  Documentation: I have reviewed the above documentation for accuracy and completeness, and I agree with the above.  Forest Gleason, MD

## 2021-02-02 NOTE — Patient Instructions (Signed)
Recommend Serica moisturizing scar formula cream every night or [facility] brand or Mederma silicone scar sheet every night for the first year after a scar appears to help with scar remodeling if desired. Scars remodel on their own for a full year.  If you have any questions or concerns for your doctor, please call our main line at [phone] and press option 4 to reach your doctor's medical assistant. If no one answers, please leave a voicemail as directed and we will return your call as soon as possible. Messages left after 4 pm will be answered the following business day.   You may also send [place] a message via [place]. We typically respond to MyChart messages within 1-2 business days.  For prescription refills, please ask your pharmacy to contact our office. Our fax number is [phone].  If you have an urgent issue when the clinic is closed that cannot wait until the next business day, you can page your doctor at the number below.    Please note that while we do our best to be available for urgent issues outside of office hours, we are not available 24/7.   If you have an urgent issue and are unable to reach [place], you may choose to seek medical care at your doctor's office, retail clinic, urgent care center, or emergency room.  If you have a medical emergency, please immediately call 911 or go to the emergency department.  Pager Numbers  - Dr. Nehemiah Massed: [phone]  - Dr. Laurence Ferrari: [phone]  - Dr. Nicole Kindred: [phone]  In the event of inclement weather, please call our main line at [phone] for an update on the status of any delays or closures.  Dermatology Medication Tips: Please keep the boxes that topical medications come in in order to help keep track of the instructions about where and how to use these. Pharmacies typically print the medication instructions only on the boxes and not directly on the medication tubes.   If your medication is too expensive, please contact our  office at [phone] option 4 or send [place] a message through [place].   We are unable to tell what your co-pay for medications will be in advance as this is different depending on your insurance coverage. However, we may be able to find a substitute medication at lower cost or fill out paperwork to get insurance to cover a needed medication.   If a prior authorization is required to get your medication covered by your insurance company, please allow [place] 1-2 business days to complete this process.  Drug prices often vary depending on where the prescription is filled and some pharmacies may offer cheaper prices.  The website www.goodrx.com contains coupons for medications through different pharmacies. The prices here do not account for what the cost may be with help from insurance (it may be cheaper with your insurance), but the website can give you the price if you did not use any insurance.  - You can print the associated coupon and take it with your prescription to the pharmacy.  - You may also stop by our office during regular business hours and pick up a GoodRx coupon card.  - If you need your prescription sent electronically to a different pharmacy, notify our office through [facility] or by phone at [phone] option 4.

## 2021-02-10 ENCOUNTER — Encounter: Payer: Self-pay | Admitting: Dermatology

## 2021-12-14 ENCOUNTER — Ambulatory Visit (INDEPENDENT_AMBULATORY_CARE_PROVIDER_SITE_OTHER): Admitting: Dermatology

## 2021-12-14 ENCOUNTER — Encounter: Payer: Self-pay | Admitting: Dermatology

## 2021-12-14 DIAGNOSIS — L578 Other skin changes due to chronic exposure to nonionizing radiation: Secondary | ICD-10-CM | POA: Diagnosis not present

## 2021-12-14 DIAGNOSIS — L820 Inflamed seborrheic keratosis: Secondary | ICD-10-CM | POA: Diagnosis not present

## 2021-12-14 DIAGNOSIS — L814 Other melanin hyperpigmentation: Secondary | ICD-10-CM | POA: Diagnosis not present

## 2021-12-14 DIAGNOSIS — D2371 Other benign neoplasm of skin of right lower limb, including hip: Secondary | ICD-10-CM

## 2021-12-14 DIAGNOSIS — I8393 Asymptomatic varicose veins of bilateral lower extremities: Secondary | ICD-10-CM

## 2021-12-14 DIAGNOSIS — Z1283 Encounter for screening for malignant neoplasm of skin: Secondary | ICD-10-CM | POA: Diagnosis not present

## 2021-12-14 DIAGNOSIS — L821 Other seborrheic keratosis: Secondary | ICD-10-CM

## 2021-12-14 DIAGNOSIS — D2361 Other benign neoplasm of skin of right upper limb, including shoulder: Secondary | ICD-10-CM

## 2021-12-14 DIAGNOSIS — D229 Melanocytic nevi, unspecified: Secondary | ICD-10-CM

## 2021-12-14 DIAGNOSIS — D1800 Hemangioma unspecified site: Secondary | ICD-10-CM

## 2021-12-14 NOTE — Patient Instructions (Addendum)
Cryotherapy Aftercare  Wash gently with soap and water everyday.   Apply Vaseline and Band-Aid daily until healed.   Recommend taking Heliocare sun protection supplement daily in sunny weather for additional sun protection. For maximum protection on the sunniest days, you can take up to 2 capsules of regular Heliocare OR take 1 capsule of Heliocare Ultra. For prolonged exposure (such as a full day in the sun), you can repeat your dose of the supplement 4 hours after your first dose. Heliocare can be purchased at [facility], at some [facility] or at www.heliocare.com.    Melanoma ABCDEs  Melanoma is the most dangerous type of skin cancer, and is the leading cause of death from skin disease.  You are more likely to develop melanoma if you: Have light-colored skin, light-colored eyes, or red or blond hair Spend a lot of time in the sun Tan regularly, either outdoors or in a tanning bed Have had blistering sunburns, especially during childhood Have a close family member who has had a melanoma Have atypical moles or large birthmarks  Early detection of melanoma is key since treatment is typically straightforward and cure rates are extremely high if we catch it early.   The first sign of melanoma is often a change in a mole or a new dark spot.  The ABCDE system is a way of remembering the signs of melanoma.  A for asymmetry:  The two halves do not match. B for border:  The edges of the growth are irregular. C for color:  A mixture of colors are present instead of an even brown color. D for diameter:  Melanomas are usually (but not always) greater than 6mm - the size of a pencil eraser. E for evolution:  The spot keeps changing in size, shape, and color.  Please check your skin once per month between visits. You can use a small mirror in front and a large mirror behind you to keep an eye on the back side or your body.   If you see any new or changing lesions before your next follow-up,  please call to schedule a visit.  Please continue daily skin protection including broad spectrum sunscreen SPF 30+ to sun-exposed areas, reapplying every 2 hours as needed when you're outdoors.    Due to recent changes in healthcare laws, you may see results of your pathology and/or laboratory studies on MyChart before the doctors have had a chance to review them. We understand that in some cases there may be results that are confusing or concerning to you. Please understand that not all results are received at the same time and often the doctors may need to interpret multiple results in order to provide you with the best plan of care or course of treatment. Therefore, we ask that you please give us 2 business days to thoroughly review all your results before contacting the office for clarification. Should we see a critical lab result, you will be contacted sooner.   If You Need Anything After Your Visit  If you have any questions or concerns for your doctor, please call our main line at [phone] and press option 4 to reach your doctor's medical assistant. If no one answers, please leave a voicemail as directed and we will return your call as soon as possible. Messages left after 4 pm will be answered the following business day.   You may also send us a message via MyChart. We typically respond to MyChart messages within 1-2 business days.    For prescription refills, please ask your pharmacy to contact our office. Our fax number is [phone].  If you have an urgent issue when the clinic is closed that cannot wait until the next business day, you can page your doctor at the number below.    Please note that while we do our best to be available for urgent issues outside of office hours, we are not available 24/7.   If you have an urgent issue and are unable to reach us, you may choose to seek medical care at your doctor's office, retail clinic, urgent care center, or emergency room.  If you  have a medical emergency, please immediately call 911 or go to the emergency department.  Pager Numbers  - Dr. Kowalski: [phone]  - Dr. Moye: [phone]  - Dr. Stewart: [phone]  In the event of inclement weather, please call our main line at [phone] for an update on the status of any delays or closures.  Dermatology Medication Tips: Please keep the boxes that topical medications come in in order to help keep track of the instructions about where and how to use these. Pharmacies typically print the medication instructions only on the boxes and not directly on the medication tubes.   If your medication is too expensive, please contact our office at [phone] option 4 or send us a message through MyChart.   We are unable to tell what your co-pay for medications will be in advance as this is different depending on your insurance coverage. However, we may be able to find a substitute medication at lower cost or fill out paperwork to get insurance to cover a needed medication.   If a prior authorization is required to get your medication covered by your insurance company, please allow us 1-2 business days to complete this process.  Drug prices often vary depending on where the prescription is filled and some pharmacies may offer cheaper prices.  The website www.goodrx.com contains coupons for medications through different pharmacies. The prices here do not account for what the cost may be with help from insurance (it may be cheaper with your insurance), but the website can give you the price if you did not use any insurance.  - You can print the associated coupon and take it with your prescription to the pharmacy.  - You may also stop by our office during regular business hours and pick up a GoodRx coupon card.  - If you need your prescription sent electronically to a different pharmacy, notify our office through [facility] MyChart or by phone at [phone] option  4.     Si Usted Necesita Algo Despus de Su Visita  Tambin puede enviarnos un mensaje a travs de MyChart. Por lo general respondemos a los mensajes de MyChart en el transcurso de 1 a 2 das hbiles.  Para renovar recetas, por favor pida a su farmacia que se ponga en contacto con nuestra oficina. Nuestro nmero de fax es el [phone].  Si tiene un asunto urgente cuando la clnica est cerrada y que no puede esperar hasta el siguiente da hbil, puede llamar/localizar a su doctor(a) al nmero que aparece a continuacin.   Por favor, tenga en cuenta que aunque hacemos todo lo posible para estar disponibles para asuntos urgentes fuera del horario de oficina, no estamos disponibles las 24 horas del da, los 7 das de la semana.   Si tiene un problema urgente y no puede comunicarse con nosotros, puede optar por buscar atencin mdica  en   el consultorio de su doctor(a), en una clnica privada, en un centro de atencin urgente o en una sala de emergencias.  Si tiene una emergencia mdica, por favor llame inmediatamente al 911 o vaya a la sala de emergencias.  Nmeros de bper  - Dr. Kowalski: [phone]  - Dra. Moye: [phone]  - Dra. Stewart: [phone]  En caso de inclemencias del tiempo, por favor llame a nuestra lnea principal al [phone] para una actualizacin sobre el estado de cualquier retraso o cierre.  Consejos para la medicacin en dermatologa: Por favor, guarde las cajas en las que vienen los medicamentos de uso tpico para ayudarle a seguir las instrucciones sobre dnde y cmo usarlos. Las farmacias generalmente imprimen las instrucciones del medicamento slo en las cajas y no directamente en los tubos del medicamento.   Si su medicamento es muy caro, por favor, pngase en contacto con nuestra oficina llamando al [phone] y presione la opcin 4 o envenos un mensaje a travs de MyChart.   No podemos decirle cul ser su copago por los medicamentos por  adelantado ya que esto es diferente dependiendo de la cobertura de su seguro. Sin embargo, es posible que podamos encontrar un medicamento sustituto a menor costo o llenar un formulario para que el seguro cubra el medicamento que se considera necesario.   Si se requiere una autorizacin previa para que su compaa de seguros cubra su medicamento, por favor permtanos de 1 a 2 das hbiles para completar este proceso.  Los precios de los medicamentos varan con frecuencia dependiendo del lugar de dnde se surte la receta y alguna farmacias pueden ofrecer precios ms baratos.  El sitio web www.goodrx.com tiene cupones para medicamentos de diferentes farmacias. Los precios aqu no tienen en cuenta lo que podra costar con la ayuda del seguro (puede ser ms barato con su seguro), pero el sitio web puede darle el precio si no utiliz ningn seguro.  - Puede imprimir el cupn correspondiente y llevarlo con su receta a la farmacia.  - Tambin puede pasar por nuestra oficina durante el horario de atencin regular y recoger una tarjeta de cupones de GoodRx.  - Si necesita que su receta se enve electrnicamente a una farmacia diferente, informe a nuestra oficina a travs de MyChart de [place] o por telfono llamando al [phone] y presione la opcin 4.  

## 2021-12-14 NOTE — Progress Notes (Signed)
Follow-Up Visit   Subjective  Alan Hamilton is a 56 y.o. male who presents for the following: Annual Exam (The patient presents for Total-Body Skin Exam (TBSE) for skin cancer screening and mole check.  The patient has spots, moles and lesions to be evaluated, some may be new or changing and the patient has concerns that these could be cancer. Patient with no hx of skin cancer. ).  Patient has a spot at left lower back that is sensitive, noticed it within the last year. He also has spots at nipples that he has had in the past, was given a cream to use and they went away but have come back.   Patient would like veins at legs evaluated. He has noticed a pulsing in them when working out.   Patient with hx of hand dermatitis, controlled with TMC 0.1% and hx of seb derm, controlled with ketoconazole shampoo and cream.  The following portions of the chart were reviewed this encounter and updated as appropriate:   Allergies  Meds  Problems  Med Hx  Surg Hx  Fam Hx      Review of Systems:  No other skin or systemic complaints except as noted in HPI or Assessment and Plan.  Objective  Well appearing patient in no apparent distress; mood and affect are within normal limits.  A full examination was performed including scalp, head, eyes, ears, nose, lips, neck, chest, axillae, abdomen, back, buttocks, bilateral upper extremities, bilateral lower extremities, hands, feet, fingers, toes, fingernails, and toenails. All findings within normal limits unless otherwise noted below.  left mid back Erythematous stuck-on, waxy papule or plaque    Assessment & Plan  Inflamed seborrheic keratosis left mid back  Symptomatic, irritating, patient would like treated.  Prior to procedure, discussed risks of blister formation, small wound, skin dyspigmentation, or rare scar following cryotherapy. Recommend Vaseline ointment to treated areas while healing.    Destruction of lesion - left mid  back  Destruction method: cryotherapy   Informed consent: discussed and consent obtained   Lesion destroyed using liquid nitrogen: Yes   Cryotherapy cycles:  2 Outcome: patient tolerated procedure well with no complications   Post-procedure details: wound care instructions given     Lentigines - Scattered tan macules - Due to sun exposure - Benign-appearing, observe - Recommend daily broad spectrum sunscreen SPF 30+ to sun-exposed areas, reapply every 2 hours as needed. - Call for any changes  Seborrheic Keratoses - Stuck-on, waxy, tan-brown papules and/or plaques  - Benign-appearing - Discussed benign etiology and prognosis. - Observe - Call for any changes  Melanocytic Nevi - Tan-brown and/or pink-flesh-colored symmetric macules and papules - Benign appearing on exam today - Observation - Call clinic for new or changing moles - Recommend daily use of broad spectrum spf 30+ sunscreen to sun-exposed areas.   Hemangiomas - Red papules - Discussed benign nature - Observe - Call for any changes  Actinic Damage - Chronic condition, secondary to cumulative UV/sun exposure - diffuse scaly erythematous macules with underlying dyspigmentation - Recommend daily broad spectrum sunscreen SPF 30+ to sun-exposed areas, reapply every 2 hours as needed.  - Staying in the shade or wearing long sleeves, sun glasses (UVA+UVB protection) and wide brim hats (4-inch brim around the entire circumference of the hat) are also recommended for sun protection.  - Call for new or changing lesions.  Dermatofibroma - Firm pink/brown papulenodule with dimple sign at right calf, right upper arm - Benign appearing - Call  for any changes  Varicose Veins/Spider Veins - Dilated blue, purple or red veins at the lower extremities - Reassured - Smaller vessels can be treated by sclerotherapy (a procedure to inject a medicine into the veins to make them disappear) if desired, but the treatment is not  covered by insurance. Larger vessels may be covered if symptomatic and we would refer to vascular surgeon if treatment desired.  Skin cancer screening performed today.  Return in about 1 year (around 12/15/2022) for TBSE.  Graciella Belton, RMA, am acting as scribe for Forest Gleason, MD .  Documentation: I have reviewed the above documentation for accuracy and completeness, and I agree with the above.  Forest Gleason, MD

## 2021-12-19 ENCOUNTER — Encounter: Payer: Self-pay | Admitting: Dermatology

## 2022-03-09 ENCOUNTER — Other Ambulatory Visit: Payer: Self-pay

## 2022-03-09 DIAGNOSIS — Z87891 Personal history of nicotine dependence: Secondary | ICD-10-CM

## 2022-03-09 DIAGNOSIS — F17210 Nicotine dependence, cigarettes, uncomplicated: Secondary | ICD-10-CM

## 2022-03-09 DIAGNOSIS — Z122 Encounter for screening for malignant neoplasm of respiratory organs: Secondary | ICD-10-CM

## 2022-03-30 ENCOUNTER — Encounter: Payer: Self-pay | Admitting: Pulmonary Disease

## 2022-03-30 ENCOUNTER — Ambulatory Visit (INDEPENDENT_AMBULATORY_CARE_PROVIDER_SITE_OTHER): Admitting: Pulmonary Disease

## 2022-03-30 DIAGNOSIS — Z122 Encounter for screening for malignant neoplasm of respiratory organs: Secondary | ICD-10-CM | POA: Diagnosis not present

## 2022-03-30 DIAGNOSIS — Z87891 Personal history of nicotine dependence: Secondary | ICD-10-CM

## 2022-03-30 NOTE — Patient Instructions (Signed)
Thank you for participating in the [facility]. It was our pleasure to meet you today. We will call you with the results of your scan within the next few days. Your scan will be assigned a Lung RADS category score by the physicians reading the scans.  This Lung RADS score determines follow up scanning.  See below for description of categories, and follow up screening recommendations. We will be in touch to schedule your follow up screening annually or based on recommendations of our providers. We will fax a copy of your scan results to your Primary Care Physician, or the physician who referred you to the program, to ensure they have the results. Please call the office if you have any questions or concerns regarding your scanning experience or results.  Our office number is [phone]. Please speak with Denise Phelps, RN. , or  Denise Buckner RN, They are  our Lung Cancer Screening RN.'s If They are unavailable when you call, Please leave a message on the voice mail. We will return your call at our earliest convenience.This voice mail is monitored several times a day.  Remember, if your scan is normal, we will scan you annually as long as you continue to meet the criteria for the program. (Age 55-77, Current smoker or smoker who has quit within the last 15 years). If you are a smoker, remember, quitting is the single most powerful action that you can take to decrease your risk of lung cancer and other pulmonary, breathing related problems. We know quitting is hard, and we are here to help.  Please let us know if there is anything we can do to help you meet your goal of quitting. If you are a former smoker, congratulations. We are proud of you! Remain smoke free! Remember you can refer friends or family members through the number above.  We will screen them to make sure they meet criteria for the program. Thank you for helping us take better care of you by  participating in Lung Screening.  You can receive free nicotine replacement therapy ( patches, gum or mints) by calling 1-800-QUIT NOW. Please call so we can get you on the path to becoming  a non-smoker. I know it is hard, but you can do this!  Lung RADS Categories:  Lung RADS 1: no nodules or definitely non-concerning nodules.  Recommendation is for a repeat annual scan in 12 months.  Lung RADS 2:  nodules that are non-concerning in appearance and behavior with a very low likelihood of becoming an active cancer. Recommendation is for a repeat annual scan in 12 months.  Lung RADS 3: nodules that are probably non-concerning , includes nodules with a low likelihood of becoming an active cancer.  Recommendation is for a 6-month repeat screening scan. Often noted after an upper respiratory illness. We will be in touch to make sure you have no questions, and to schedule your 6-month scan.  Lung RADS 4 A: nodules with concerning findings, recommendation is most often for a follow up scan in 3 months or additional testing based on our provider's assessment of the scan. We will be in touch to make sure you have no questions and to schedule the recommended 3 month follow up scan.  Lung RADS 4 B:  indicates findings that are concerning. We will be in touch with you to schedule additional diagnostic testing based on our provider's  assessment of the scan.  Other options for assistance in smoking cessation (   As covered by your insurance benefits)  Hypnosis for smoking cessation  [employer] [phone]  Acupuncture for smoking cessation  [facility] [phone]   

## 2022-03-30 NOTE — Progress Notes (Signed)
Shared Decision Making Visit Lung Cancer Screening Program [phone])   Eligibility: Age 56 y.o. Pack Years Smoking History Calculation 28 (# packs/per year x # years smoked) Recent History of coughing up blood  no Unexplained weight loss? no ( >Than 15 pounds within the last 6 months ) Prior History Lung / other cancer no (Diagnosis within the last 5 years already requiring surveillance chest CT Scans). Smoking Status Current Smoker - 1 ppd   Visit Components: Discussion included one or more decision making aids. yes Discussion included risk/benefits of screening. yes Discussion included potential follow up diagnostic testing for abnormal scans. yes Discussion included meaning and risk of over diagnosis. yes Discussion included meaning and risk of False Positives. yes Discussion included meaning of total radiation exposure. yes  Counseling Included: Importance of adherence to annual lung cancer LDCT screening. yes Impact of comorbidities on ability to participate in the program. yes Ability and willingness to under diagnostic treatment. yes  Smoking Cessation Counseling: Current Smokers:  Discussed importance of smoking cessation. yes Information about tobacco cessation classes and interventions provided to patient. yes Patient provided with "ticket" for LDCT Scan. yes Asymptomatic Patient yes  Counseling (Intermediate counseling: > three minutes counseling) U0454 Information about tobacco cessation classes and interventions provided to patient. Yes Patient provided with "ticket" for LDCT Scan. yes Written Order for Lung Cancer Screening with LDCT placed in Epic. Yes (CT Chest Lung Cancer Screening Low Dose W/O CM) [id number] Z12.2-Screening of respiratory organs Z87.891-Personal history of nicotine dependence   Lauraine Rinne, NP

## 2022-04-02 ENCOUNTER — Ambulatory Visit
Admission: RE | Admit: 2022-04-02 | Discharge: 2022-04-02 | Disposition: A | Discharge status: Home / Self Care | Source: Ambulatory Visit | Attending: Acute Care | Admitting: Acute Care

## 2022-04-02 DIAGNOSIS — I2510 Atherosclerotic heart disease of native coronary artery without angina pectoris: Secondary | ICD-10-CM | POA: Insufficient documentation

## 2022-04-02 DIAGNOSIS — F17210 Nicotine dependence, cigarettes, uncomplicated: Secondary | ICD-10-CM | POA: Insufficient documentation

## 2022-04-02 DIAGNOSIS — I700 Atherosclerosis of aorta: Secondary | ICD-10-CM | POA: Diagnosis not present

## 2022-04-02 DIAGNOSIS — Z122 Encounter for screening for malignant neoplasm of respiratory organs: Secondary | ICD-10-CM | POA: Diagnosis present

## 2022-04-02 DIAGNOSIS — Z87891 Personal history of nicotine dependence: Secondary | ICD-10-CM

## 2022-04-11 ENCOUNTER — Telehealth: Payer: Self-pay | Admitting: Acute Care

## 2022-04-11 DIAGNOSIS — Z87891 Personal history of nicotine dependence: Secondary | ICD-10-CM

## 2022-04-11 DIAGNOSIS — F17210 Nicotine dependence, cigarettes, uncomplicated: Secondary | ICD-10-CM

## 2022-04-11 DIAGNOSIS — Z122 Encounter for screening for malignant neoplasm of respiratory organs: Secondary | ICD-10-CM

## 2022-04-11 NOTE — Telephone Encounter (Signed)
I have called the patient with the results of his low-dose CT. his scan was read as a lung RADS 2 continue annual lung cancer screening in 12 months. There was an incidental finding of a potential thymic lesion.  When I called patient I asked him if he was having any weakness, eyelid drooping, difficulty swallowing and he denied all of the above.  I have faxed the results to the patient's PCP's office  [facility].  I have also called and spoken with the office and asked that they make sure that Celina sees the patient's CT scan so that she can determine if she feels any additional work-up of this finding is indicated clinically. 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  Patient is currently on statin therapy.  Again I just wanted to make sure you were aware.  Langley Gauss I have faxed the results to the patient's PCP, please place order for annual lung cancer screening scan for 04/07/2023.  Thanks so much

## 2022-04-11 NOTE — Telephone Encounter (Signed)
Order placed for annual LDCT 2024

## 2022-04-11 NOTE — Telephone Encounter (Signed)
I have called the patient with the results of his low

## 2022-04-12 NOTE — Telephone Encounter (Signed)
See other telephone note from 04/11/22

## 2022-08-01 ENCOUNTER — Encounter: Payer: Self-pay | Admitting: Dermatology

## 2022-08-01 ENCOUNTER — Ambulatory Visit (INDEPENDENT_AMBULATORY_CARE_PROVIDER_SITE_OTHER): Payer: Self-pay | Admitting: Dermatology

## 2022-08-01 VITALS — BP 162/96 | HR 91

## 2022-08-01 DIAGNOSIS — I83813 Varicose veins of bilateral lower extremities with pain: Secondary | ICD-10-CM

## 2022-08-01 NOTE — Patient Instructions (Addendum)
Compression stockings can be found at [place]  1. When you telephone for your appointment for the sclerotherapy procedure, please let the receptionist know that you are scheduling for the fifteen (15) minute sclerotherapy procedure not just a regular visit.  2. On the day of the procedure, please cleanse and dry the areas, but do not use any moisturizers or other products on the area(s) to be treated.  3. Bring a pair of comfortable shorts to wear during the procedure.  4. Be sure to bring your recommended graduated compression stockings with you to the office. You will be wearing them home when your visit is over. These compression hose can be purchased at most medical supply stores.  After Your Sclerotherapy Procedure  1. Please wear the graduated compression stockings for 24 hours immediately following the completion of the sclerotherapy procedure.  2. We recommend that you avoid vigorous activity as much as possible for the first twenty-four (24) hours. You can do your "normal" routine, but avoid an above normal amount of time on your feet. Elevating the legs when sitting and avoidance of vigorous leg movements or exercise in the first few days after treatment may improve your results.  3. You may remove the compression dressings (cotton balls) and tape the next morning.  4. Please continue wearing the compression stockings during waking hours for the two (2) weeks following sclerotherapy.  5. If you have any blisters, sores or ulcers or other problems following your procedure please call or return to the office immediately.     THE PROCEDURE FEE IS $350.00 PER FIFTEEN (15) MINUTE SESSION. WE REQUIRE THAT THIS PROCEDURE BE PAID FOR IN FULL ON OR BEFORE THE DATE THAT IT IS PERFORMED. WE WILL GIVE YOU A RECEIPT THAT YOU CAN FILE WITH YOUR INSURANCE COMPANY. WE GENERALLY DO NOT FILE THIS PROCEDURE WITH ANY INSURANCE COMPANY EXCEPT  UNDER CERTAIN CIRCUMSTANCES WHERE PRIOR AUTHORIZATION HAS BEEN CONFIRMED. THIS PROCEDURE IS GENERALLY CONSIDERED TO BE A COSMETIC PROCEDURE BY INSURANCE COMPANIES.    Due to recent changes in healthcare laws, you may see results of your pathology and/or laboratory studies on MyChart before the doctors have had a chance to review them. We understand that in some cases there may be results that are confusing or concerning to you. Please understand that not all results are received at the same time and often the doctors may need to interpret multiple results in order to provide you with the best plan of care or course of treatment. Therefore, we ask that you please give [place] 2 business days to thoroughly review all your results before contacting the office for clarification. Should we see a critical lab result, you will be contacted sooner.   If You Need Anything After Your Visit  If you have any questions or concerns for your doctor, please call our main line at [phone] and press option 4 to reach your doctor's medical assistant. If no one answers, please leave a voicemail as directed and we will return your call as soon as possible. Messages left after 4 pm will be answered the following business day.   You may also send [place] a message via [place]. We typically respond to MyChart messages within 1-2 business days.  For prescription refills, please ask your pharmacy to contact our office. Our fax number is [phone].  If you have an urgent issue when the clinic is closed that cannot wait until the next business  day, you can page your doctor at the number below.    Please note that while we do our best to be available for urgent issues outside of office hours, we are not available 24/7.   If you have an urgent issue and are unable to reach [place], you may choose to seek medical care at your doctor's office, retail clinic, urgent care center, or emergency room.  If you have a medical emergency,  please immediately call 911 or go to the emergency department.  Pager Numbers  - Dr. Nehemiah Massed: [phone]  - Dr. Laurence Ferrari: [phone]  - Dr. Nicole Kindred: [phone]  In the event of inclement weather, please call our main line at [phone] for an update on the status of any delays or closures.  Dermatology Medication Tips: Please keep the boxes that topical medications come in in order to help keep track of the instructions about where and how to use these. Pharmacies typically print the medication instructions only on the boxes and not directly on the medication tubes.   If your medication is too expensive, please contact our office at [phone] option 4 or send [place] a message through [place].   We are unable to tell what your co-pay for medications will be in advance as this is different depending on your insurance coverage. However, we may be able to find a substitute medication at lower cost or fill out paperwork to get insurance to cover a needed medication.   If a prior authorization is required to get your medication covered by your insurance company, please allow [place] 1-2 business days to complete this process.  Drug prices often vary depending on where the prescription is filled and some pharmacies may offer cheaper prices.  The website www.goodrx.com contains coupons for medications through different pharmacies. The prices here do not account for what the cost may be with help from insurance (it may be cheaper with your insurance), but the website can give you the price if you did not use any insurance.  - You can print the associated coupon and take it with your prescription to the pharmacy.  - You may also stop by our office during regular business hours and pick up a GoodRx coupon card.  - If you need your prescription sent electronically to a different pharmacy, notify our office through [facility] or by phone at [phone] option 4.     Si Usted Necesita Algo  Despus de Su Visita  Tambin puede enviarnos un mensaje a travs de Pharmacist, community. Por lo general respondemos a los mensajes de MyChart en el transcurso de 1 a 2 das hbiles.  Para renovar recetas, por favor pida a su farmacia que se ponga en contacto con nuestra oficina. Harland Dingwall de fax es [place] [phone].  Si tiene un asunto urgente cuando la clnica est cerrada y que no puede esperar hasta el siguiente da hbil, puede llamar/localizar a su doctor(a) al nmero que aparece a continuacin.   Por favor, tenga en cuenta que aunque hacemos todo lo posible para estar disponibles para asuntos urgentes fuera del horario de [place], no estamos disponibles las 24 horas del da, los 7 das de la [place].   Si tiene un problema urgente y no puede comunicarse con nosotros, puede optar por buscar atencin mdica  en el consultorio de su doctor(a), en una clnica privada, en un centro de atencin urgente o en una sala de emergencias.  Si tiene una emergencia mdica, por favor llame inmediatamente al 911 o vaya  a la sala de Multimedia programmer.  Nmeros de bper  - Dr. Nehemiah Massed: [phone]  - Dra. Moye: [phone]  - Dra. Nicole Kindred: [phone]  En caso de inclemencias del [place], por favor llame a Johnsie Kindred principal al [phone] para una actualizacin sobre el [place] de cualquier retraso o cierre.  Consejos para la medicacin en dermatologa: Por favor, guarde las cajas en las que vienen los medicamentos de uso tpico para ayudarle a seguir las instrucciones sobre dnde y cmo usarlos. Las farmacias generalmente imprimen las instrucciones del medicamento slo en las cajas y no directamente en los tubos del [place].   Si su medicamento es muy caro, por favor, pngase en contacto con Zigmund Daniel llamando al [phone] y presione la opcin 4 o envenos un mensaje a travs de Pharmacist, community.   No podemos decirle cul ser su copago por los medicamentos por adelantado ya que esto es diferente  dependiendo de la cobertura de su seguro. Sin embargo, es posible que podamos encontrar un medicamento sustituto a Electrical engineer un formulario para que el seguro cubra el medicamento que se considera necesario.   Si se requiere una autorizacin previa para que su compaa de seguros [place] su medicamento, por favor permtanos de 1 a 2 das hbiles para completar este proceso.  Los precios de los medicamentos varan con frecuencia dependiendo del Environmental consultant de dnde se surte la receta y alguna farmacias pueden ofrecer precios ms baratos.  El sitio web www.goodrx.com tiene cupones para medicamentos de Airline pilot. Los precios aqu no tienen en cuenta lo que podra costar con la ayuda del seguro (puede ser ms barato con su seguro), pero el sitio web puede darle el precio si no utiliz Research scientist (physical sciences).  - Puede imprimir el cupn correspondiente y llevarlo con su receta a la farmacia.  - Tambin puede pasar por nuestra oficina durante el horario de atencin regular y Charity fundraiser una tarjeta de cupones de GoodRx.  - Si necesita que su receta se enve electrnicamente a una farmacia diferente, informe a nuestra oficina a travs de MyChart de [place] o por telfono llamando al [phone] y presione la opcin 4.

## 2022-08-01 NOTE — Progress Notes (Unsigned)
   Follow-Up Visit   Subjective  Alan Hamilton is a 57 y.o. male who presents for the following: Other (Patient is here for Schlerotherapy treatment to veins at legs.).  The following portions of the chart were reviewed this encounter and updated as appropriate:  Tobacco  Allergies  Problems  Med Hx  Surg Hx  Fam Hx      Review of Systems: No other skin or systemic complaints except as noted in HPI or Assessment and Plan.   Objective  Well appearing patient in no apparent distress; mood and affect are within normal limits.  A focused examination was performed including b/l lower legs. Relevant physical exam findings are noted in the Assessment and Plan.  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 Reviewed risk of blood clots and increased risk of clotting in smokers. Recommended smoking cessation at least for a few days. He is amenable to this.  Aftercare instructions included in patient handout  Sclerotherapy - b/l lower extremities The patient presents for desired sclerotherapy for desired treatment of desired treatment of small to medium varicosities of the bilateral lower extremities.  Procedure: The patient was counseled and understands about the effects, side effects and potential risks and complications of the sclerotherapy procedure. The patient was given the opportunity to ask questions. Asclera (polidocanol) 1% (total 6cc) was injected into the varices. In order to ensure correct placement of the catheter in the vein, I drew back slightly to give moderate blood show in the syringe. If there was any evidence or suspicion of extravasation of sclerosant, the area was immediately diluted with a large volume of 0.9% saline. A pressure dressing was applied  immediately to the injected sites. The patient tolerated the procedure well without complication. The patient was instructed in post-operative compression stocking use. The patient understands to call or return immediately if any problems noted.     Return for 6 week sclerotherapy followup. I, Ruthell Rummage, CMA, am acting as scribe for Forest Gleason, MD.  Documentation: I have reviewed the above documentation for accuracy and completeness, and I agree with the above.  Forest Gleason, MD

## 2022-09-12 ENCOUNTER — Encounter: Payer: Self-pay | Admitting: Dermatology

## 2022-09-12 ENCOUNTER — Ambulatory Visit (INDEPENDENT_AMBULATORY_CARE_PROVIDER_SITE_OTHER): Admitting: Dermatology

## 2022-09-12 VITALS — BP 127/80 | HR 91

## 2022-09-12 DIAGNOSIS — I8393 Asymptomatic varicose veins of bilateral lower extremities: Secondary | ICD-10-CM

## 2022-09-12 NOTE — Progress Notes (Unsigned)
   Follow-Up Visit   Subjective  Alan Hamilton is a 57 y.o. male who presents for the following: Sclerotherapy follow up. B/L legs. Tx 08/01/2022. Patient states he is please with results.   The following portions of the chart were reviewed this encounter and updated as appropriate: medications, allergies, medical history  Review of Systems:  No other skin or systemic complaints except as noted in HPI or Assessment and Plan.  Objective  Well appearing patient in no apparent distress; mood and affect are within normal limits.   A focused examination was performed of the following areas: B/L legs  Relevant exam findings are noted in the Assessment and Plan.   Assessment & Plan   Varicose Veins/Spider Veins - Dilated blue, purple or red veins at the lower extremities - Reassured - Smaller vessels can be treated by sclerotherapy (a procedure to inject a medicine into the veins to make them disappear) if desired, but the treatment is not covered by insurance. Larger vessels may be covered if symptomatic and we would refer to vascular surgeon if treatment desired. RTC in 1 month for 2nd round of sclerotherapy.                  Return in about 1 month (around 10/13/2022) for Sclerotherapy .  I, Emelia Salisbury, CMA, am acting as scribe for Forest Gleason, MD.   Documentation: I have reviewed the above documentation for accuracy and completeness, and I agree with the above.  Forest Gleason, MD

## 2022-09-12 NOTE — Patient Instructions (Signed)
Due to recent changes in healthcare laws, you may see results of your pathology and/or laboratory studies on MyChart before the doctors have had a chance to review them. We understand that in some cases there may be results that are confusing or concerning to you. Please understand that not all results are received at the same time and often the doctors may need to interpret multiple results in order to provide you with the best plan of care or course of treatment. Therefore, we ask that you please give us 2 business days to thoroughly review all your results before contacting the office for clarification. Should we see a critical lab result, you will be contacted sooner.   If You Need Anything After Your Visit  If you have any questions or concerns for your doctor, please call our main line at [phone] and press option 4 to reach your doctor's medical assistant. If no one answers, please leave a voicemail as directed and we will return your call as soon as possible. Messages left after 4 pm will be answered the following business day.   You may also send us a message via MyChart. We typically respond to MyChart messages within 1-2 business days.  For prescription refills, please ask your pharmacy to contact our office. Our fax number is [phone].  If you have an urgent issue when the clinic is closed that cannot wait until the next business day, you can page your doctor at the number below.    Please note that while we do our best to be available for urgent issues outside of office hours, we are not available 24/7.   If you have an urgent issue and are unable to reach us, you may choose to seek medical care at your doctor's office, retail clinic, urgent care center, or emergency room.  If you have a medical emergency, please immediately call 911 or go to the emergency department.  Pager Numbers  - Dr. Kowalski: [phone]  - Dr. Moye: [phone]  - Dr. Stewart:  [phone]  In the event of inclement weather, please call our main line at [phone] for an update on the status of any delays or closures.  Dermatology Medication Tips: Please keep the boxes that topical medications come in in order to help keep track of the instructions about where and how to use these. Pharmacies typically print the medication instructions only on the boxes and not directly on the medication tubes.   If your medication is too expensive, please contact our office at [phone] option 4 or send us a message through MyChart.   We are unable to tell what your co-pay for medications will be in advance as this is different depending on your insurance coverage. However, we may be able to find a substitute medication at lower cost or fill out paperwork to get insurance to cover a needed medication.   If a prior authorization is required to get your medication covered by your insurance company, please allow us 1-2 business days to complete this process.  Drug prices often vary depending on where the prescription is filled and some pharmacies may offer cheaper prices.  The website www.goodrx.com contains coupons for medications through different pharmacies. The prices here do not account for what the cost may be with help from insurance (it may be cheaper with your insurance), but the website can give you the price if you did not use any insurance.  - You can print the associated coupon and take it with   your prescription to the pharmacy.  - You may also stop by our office during regular business hours and pick up a GoodRx coupon card.  - If you need your prescription sent electronically to a different pharmacy, notify our office through [facility] MyChart or by phone at [phone] option 4.     Si Usted Necesita Algo Despus de Su Visita  Tambin puede enviarnos un mensaje a travs de MyChart. Por lo general respondemos a los mensajes de MyChart en el transcurso de 1 a 2  das hbiles.  Para renovar recetas, por favor pida a su farmacia que se ponga en contacto con nuestra oficina. Nuestro nmero de fax es el [phone].  Si tiene un asunto urgente cuando la clnica est cerrada y que no puede esperar hasta el siguiente da hbil, puede llamar/localizar a su doctor(a) al nmero que aparece a continuacin.   Por favor, tenga en cuenta que aunque hacemos todo lo posible para estar disponibles para asuntos urgentes fuera del horario de oficina, no estamos disponibles las 24 horas del da, los 7 das de la semana.   Si tiene un problema urgente y no puede comunicarse con nosotros, puede optar por buscar atencin mdica  en el consultorio de su doctor(a), en una clnica privada, en un centro de atencin urgente o en una sala de emergencias.  Si tiene una emergencia mdica, por favor llame inmediatamente al 911 o vaya a la sala de emergencias.  Nmeros de bper  - Dr. Kowalski: [phone]  - Dra. Moye: [phone]  - Dra. Stewart: [phone]  En caso de inclemencias del tiempo, por favor llame a nuestra lnea principal al [phone] para una actualizacin sobre el estado de cualquier retraso o cierre.  Consejos para la medicacin en dermatologa: Por favor, guarde las cajas en las que vienen los medicamentos de uso tpico para ayudarle a seguir las instrucciones sobre dnde y cmo usarlos. Las farmacias generalmente imprimen las instrucciones del medicamento slo en las cajas y no directamente en los tubos del medicamento.   Si su medicamento es muy caro, por favor, pngase en contacto con nuestra oficina llamando al [phone] y presione la opcin 4 o envenos un mensaje a travs de MyChart.   No podemos decirle cul ser su copago por los medicamentos por adelantado ya que esto es diferente dependiendo de la cobertura de su seguro. Sin embargo, es posible que podamos encontrar un medicamento sustituto a menor costo o llenar un formulario para que el  seguro cubra el medicamento que se considera necesario.   Si se requiere una autorizacin previa para que su compaa de seguros cubra su medicamento, por favor permtanos de 1 a 2 das hbiles para completar este proceso.  Los precios de los medicamentos varan con frecuencia dependiendo del lugar de dnde se surte la receta y alguna farmacias pueden ofrecer precios ms baratos.  El sitio web www.goodrx.com tiene cupones para medicamentos de diferentes farmacias. Los precios aqu no tienen en cuenta lo que podra costar con la ayuda del seguro (puede ser ms barato con su seguro), pero el sitio web puede darle el precio si no utiliz ningn seguro.  - Puede imprimir el cupn correspondiente y llevarlo con su receta a la farmacia.  - Tambin puede pasar por nuestra oficina durante el horario de atencin regular y recoger una tarjeta de cupones de GoodRx.  - Si necesita que su receta se enve electrnicamente a una farmacia diferente, informe a nuestra oficina a travs de MyChart de [facility]   o por telfono llamando al [phone] y presione la opcin 4.  

## 2022-10-10 ENCOUNTER — Ambulatory Visit: Admitting: Dermatology

## 2022-10-17 ENCOUNTER — Ambulatory Visit: Admitting: Dermatology

## 2022-11-27 ENCOUNTER — Ambulatory Visit: Admitting: Dermatology

## 2022-12-27 ENCOUNTER — Encounter: Admitting: Dermatology

## 2023-01-10 ENCOUNTER — Encounter: Admitting: Dermatology

## 2023-03-21 ENCOUNTER — Ambulatory Visit: Admitting: Dermatology

## 2023-04-04 ENCOUNTER — Ambulatory Visit
Admission: RE | Admit: 2023-04-04 | Discharge: 2023-04-04 | Disposition: A | Discharge status: Home / Self Care | Source: Ambulatory Visit | Attending: Family Medicine | Admitting: Family Medicine

## 2023-04-04 DIAGNOSIS — Z87891 Personal history of nicotine dependence: Secondary | ICD-10-CM | POA: Insufficient documentation

## 2023-04-04 DIAGNOSIS — Z122 Encounter for screening for malignant neoplasm of respiratory organs: Secondary | ICD-10-CM | POA: Insufficient documentation

## 2023-04-04 DIAGNOSIS — F17210 Nicotine dependence, cigarettes, uncomplicated: Secondary | ICD-10-CM | POA: Insufficient documentation

## 2023-04-15 ENCOUNTER — Other Ambulatory Visit: Payer: Self-pay

## 2023-04-15 DIAGNOSIS — F17210 Nicotine dependence, cigarettes, uncomplicated: Secondary | ICD-10-CM

## 2023-04-15 DIAGNOSIS — Z122 Encounter for screening for malignant neoplasm of respiratory organs: Secondary | ICD-10-CM

## 2023-04-15 DIAGNOSIS — Z87891 Personal history of nicotine dependence: Secondary | ICD-10-CM

## 2024-01-23 ENCOUNTER — Encounter: Payer: Self-pay | Admitting: Dermatology

## 2024-01-23 ENCOUNTER — Ambulatory Visit: Admitting: Dermatology

## 2024-01-23 DIAGNOSIS — L578 Other skin changes due to chronic exposure to nonionizing radiation: Secondary | ICD-10-CM | POA: Diagnosis not present

## 2024-01-23 DIAGNOSIS — L570 Actinic keratosis: Secondary | ICD-10-CM

## 2024-01-23 DIAGNOSIS — Z7189 Other specified counseling: Secondary | ICD-10-CM

## 2024-01-23 DIAGNOSIS — L814 Other melanin hyperpigmentation: Secondary | ICD-10-CM | POA: Diagnosis not present

## 2024-01-23 DIAGNOSIS — L219 Seborrheic dermatitis, unspecified: Secondary | ICD-10-CM

## 2024-01-23 DIAGNOSIS — W908XXA Exposure to other nonionizing radiation, initial encounter: Secondary | ICD-10-CM

## 2024-01-23 DIAGNOSIS — Z1283 Encounter for screening for malignant neoplasm of skin: Secondary | ICD-10-CM | POA: Diagnosis not present

## 2024-01-23 DIAGNOSIS — Z79899 Other long term (current) drug therapy: Secondary | ICD-10-CM

## 2024-01-23 DIAGNOSIS — L2089 Other atopic dermatitis: Secondary | ICD-10-CM

## 2024-01-23 DIAGNOSIS — D1801 Hemangioma of skin and subcutaneous tissue: Secondary | ICD-10-CM

## 2024-01-23 DIAGNOSIS — D229 Melanocytic nevi, unspecified: Secondary | ICD-10-CM

## 2024-01-23 MED ORDER — HYDROCORTISONE 2.5 % EX CREA: TOPICAL_CREAM | CUTANEOUS | 11 refills | Status: AC

## 2024-01-23 MED ORDER — VTAMA 1 % EX CREA: TOPICAL_CREAM | CUTANEOUS | 6 refills | Status: AC

## 2024-01-23 MED ORDER — KETOCONAZOLE 2 % EX CREA: TOPICAL_CREAM | CUTANEOUS | 11 refills | Status: AC

## 2024-01-23 NOTE — Progress Notes (Signed)
 Follow-Up Visit   Subjective  Alan Hamilton is a 58 y.o. male who presents for the following: Skin Cancer Screening and Full Body Skin Exam. Rash on his face treating with Hydrocortisone  cream and Ketoconazole  cream- helping. Rash on his hands treating with Triamcinolone  ointment- not much help.   The patient presents for Total-Body Skin Exam (TBSE) for skin cancer screening and mole check. The patient has spots, moles and lesions to be evaluated, some may be new or changing and the patient may have concern these could be cancer.  The following portions of the chart were reviewed this encounter and updated as appropriate: medications, allergies, medical history  Review of Systems:  No other skin or systemic complaints except as noted in HPI or Assessment and Plan.  Objective  Well appearing patient in no apparent distress; mood and affect are within normal limits.  A full examination was performed including scalp, head, eyes, ears, nose, lips, neck, chest, axillae, abdomen, back, buttocks, bilateral upper extremities, bilateral lower extremities, hands, feet, fingers, toes, fingernails, and toenails. All findings within normal limits unless otherwise noted below.   Relevant physical exam findings are noted in the Assessment and Plan.  temples x 7 (7) Erythematous thin papules/macules with gritty scale.   Assessment & Plan   SKIN CANCER SCREENING PERFORMED TODAY.  ACTINIC DAMAGE - Chronic condition, secondary to cumulative UV/sun exposure - diffuse scaly erythematous macules with underlying dyspigmentation - Recommend daily broad spectrum sunscreen SPF 30+ to sun-exposed areas, reapply every 2 hours as needed.  - Staying in the shade or wearing long sleeves, sun glasses (UVA+UVB protection) and wide brim hats (4-inch brim around the entire circumference of the hat) are also recommended for sun protection.  - Call for new or changing lesions.  LENTIGINES, SEBORRHEIC KERATOSES,  HEMANGIOMAS - Benign normal skin lesions - Benign-appearing - Call for any changes  MELANOCYTIC NEVI - Tan-brown and/or pink-flesh-colored symmetric macules and papules - Benign appearing on exam today - Observation - Call clinic for new or changing moles - Recommend daily use of broad spectrum spf 30+ sunscreen to sun-exposed areas.   SEBORRHEIC DERMATITIS Exam: Pink patches with greasy scale on the face  Chronic and persistent condition with duration or expected duration over one year. Condition is symptomatic/ bothersome to patient. Not currently at goal.  Seborrheic Dermatitis is a chronic persistent rash characterized by pinkness and scaling most commonly of the mid face but also can occur on the scalp (dandruff), ears; mid chest, mid back and groin.  It tends to be exacerbated by stress and cooler weather.  People who have neurologic disease may experience new onset or exacerbation of existing seborrheic dermatitis.  The condition is not curable but treatable and can be controlled. Treatment Plan: Re-Start ketoconazole  2% cream twice daily as needed Re-start HC 2.5% cream twice daily for up to 1 week to face as needed for flares     ATOPIC DERMATITIS/hand dermatitis  Exam: Scaly pink papules coalescing to plaque on the hands  Chronic and persistent condition with duration or expected duration over one year. Condition is symptomatic/ bothersome to patient. Not currently at goal.  Atopic dermatitis (eczema) is a chronic, relapsing, pruritic condition that can significantly affect quality of life. It is often associated with allergic rhinitis and/or asthma and can require treatment with topical medications, phototherapy, or in severe cases biologic injectable medication (Dupixent; Adbry) or Oral JAK inhibitors. Treatment Plan: D/C Triamcinolone  ointment  Start Vtama  cream apply to hands twice a  day   Recommend gentle skin care.   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  - Recommend staying in the shade or wearing long sleeves, sun glasses (UVA+UVB protection) and wide brim hats (4-inch brim around the entire circumference of the hat). - Call for new or changing lesions.  Destruction of lesion - temples x 7 (7) Complexity: simple   Destruction method: cryotherapy   Informed consent: discussed and consent obtained   Timeout:  patient name, date of birth, surgical site, and procedure verified Lesion destroyed using liquid nitrogen: Yes   Region frozen until ice ball extended beyond lesion: Yes   Outcome: patient tolerated procedure well with no complications   Post-procedure details: wound care instructions given    SEBORRHEIC DERMATITIS   Related Medications ketoconazole  (NIZORAL ) 2 % shampoo Wash AA 3 times weekly leaving on for 10 minutes before rinsing. hydrocortisone  2.5 % cream Apply to face Mon, Wed, Fri at bedtime ketoconazole  (NIZORAL ) 2 % cream Apply to face Tues, Thur and Sat at bedtime    Return in about 1 year (around 01/22/2025) for TBSE, hx of Aks .  IFay Kirks, CMA, am acting as scribe for Alm Rhyme, MD .   Documentation: I have reviewed the above documentation for accuracy and completeness, and I agree with the above.  Alm Rhyme, MD

## 2024-01-23 NOTE — Patient Instructions (Addendum)

## 2024-01-30 ENCOUNTER — Telehealth: Payer: Self-pay

## 2024-01-30 MED ORDER — TACROLIMUS 0.1 % EX OINT: TOPICAL_OINTMENT | Freq: Two times a day (BID) | CUTANEOUS | 1 refills | Status: DC

## 2024-01-30 NOTE — Telephone Encounter (Signed)
 Patient's Vtama  denied until patient tries and fails Tacrolimus  or Pimecrolimus. aw

## 2024-01-30 NOTE — Telephone Encounter (Signed)
 RX sent in and left patient detailed voicemail. aw

## 2024-02-23 ENCOUNTER — Other Ambulatory Visit: Payer: Self-pay

## 2024-02-23 MED ORDER — TACROLIMUS 0.1 % EX OINT: TOPICAL_OINTMENT | CUTANEOUS | 2 refills | Status: AC

## 2024-02-23 NOTE — Progress Notes (Signed)
 Fax from [facility] requesting 90 day supply. aw

## 2024-04-07 ENCOUNTER — Ambulatory Visit
Admission: RE | Admit: 2024-04-07 | Discharge: 2024-04-07 | Disposition: A | Discharge status: Home / Self Care | Source: Ambulatory Visit | Attending: Acute Care | Admitting: Acute Care

## 2024-04-07 DIAGNOSIS — Z122 Encounter for screening for malignant neoplasm of respiratory organs: Secondary | ICD-10-CM | POA: Diagnosis present

## 2024-04-07 DIAGNOSIS — Z87891 Personal history of nicotine dependence: Secondary | ICD-10-CM | POA: Insufficient documentation

## 2024-04-07 DIAGNOSIS — F17210 Nicotine dependence, cigarettes, uncomplicated: Secondary | ICD-10-CM | POA: Insufficient documentation

## 2024-04-10 ENCOUNTER — Other Ambulatory Visit: Payer: Self-pay | Admitting: Acute Care

## 2024-04-10 DIAGNOSIS — Z122 Encounter for screening for malignant neoplasm of respiratory organs: Secondary | ICD-10-CM

## 2024-04-10 DIAGNOSIS — Z87891 Personal history of nicotine dependence: Secondary | ICD-10-CM

## 2024-04-10 DIAGNOSIS — F17210 Nicotine dependence, cigarettes, uncomplicated: Secondary | ICD-10-CM

## 2025-01-26 ENCOUNTER — Ambulatory Visit: Admitting: Dermatology
